# Patient Record
Sex: Female | Born: 1955 | Race: White | Hispanic: No | Marital: Married | Smoking: Former smoker
Health system: Southern US, Community
[De-identification: ages and names within clinical notes are randomized; demographics above are authoritative.]

## PROBLEM LIST (undated history)

## (undated) DIAGNOSIS — I10 Essential (primary) hypertension: Secondary | ICD-10-CM

## (undated) DIAGNOSIS — F32A Depression, unspecified: Secondary | ICD-10-CM

## (undated) DIAGNOSIS — F329 Major depressive disorder, single episode, unspecified: Secondary | ICD-10-CM

## (undated) DIAGNOSIS — J449 Chronic obstructive pulmonary disease, unspecified: Secondary | ICD-10-CM

## (undated) DIAGNOSIS — M7918 Myalgia, other site: Secondary | ICD-10-CM

## (undated) HISTORY — PX: ABDOMINAL HYSTERECTOMY: SHX81

## (undated) HISTORY — PX: HERNIA REPAIR: SHX51

---

## 1997-08-21 ENCOUNTER — Ambulatory Visit (HOSPITAL_COMMUNITY): Admission: RE | Admit: 1997-08-21 | Discharge: 1997-08-21 | Payer: Self-pay | Admitting: Podiatry

## 1999-11-04 ENCOUNTER — Ambulatory Visit (HOSPITAL_COMMUNITY): Admission: RE | Admit: 1999-11-04 | Discharge: 1999-11-05 | Payer: Self-pay | Admitting: Surgery

## 2002-10-01 ENCOUNTER — Ambulatory Visit (HOSPITAL_COMMUNITY): Admission: RE | Admit: 2002-10-01 | Discharge: 2002-10-01 | Payer: Self-pay | Admitting: Gastroenterology

## 2002-10-04 ENCOUNTER — Encounter: Payer: Self-pay | Admitting: Internal Medicine

## 2002-10-04 ENCOUNTER — Encounter: Admission: RE | Admit: 2002-10-04 | Discharge: 2002-10-04 | Payer: Self-pay | Admitting: Internal Medicine

## 2002-10-10 ENCOUNTER — Encounter: Admission: RE | Admit: 2002-10-10 | Discharge: 2002-10-10 | Payer: Self-pay | Admitting: Internal Medicine

## 2002-10-10 ENCOUNTER — Encounter: Payer: Self-pay | Admitting: Internal Medicine

## 2002-10-17 ENCOUNTER — Encounter: Admission: RE | Admit: 2002-10-17 | Discharge: 2002-10-17 | Payer: Self-pay | Admitting: Internal Medicine

## 2002-10-17 ENCOUNTER — Encounter: Payer: Self-pay | Admitting: Internal Medicine

## 2002-10-22 ENCOUNTER — Ambulatory Visit (HOSPITAL_COMMUNITY): Admission: RE | Admit: 2002-10-22 | Discharge: 2002-10-22 | Payer: Self-pay | Admitting: Gastroenterology

## 2005-01-06 ENCOUNTER — Encounter: Admission: RE | Admit: 2005-01-06 | Discharge: 2005-01-06 | Payer: Self-pay | Admitting: Internal Medicine

## 2005-03-15 ENCOUNTER — Ambulatory Visit (HOSPITAL_COMMUNITY): Admission: RE | Admit: 2005-03-15 | Discharge: 2005-03-15 | Payer: Self-pay | Admitting: Specialist

## 2005-03-15 ENCOUNTER — Ambulatory Visit (HOSPITAL_BASED_OUTPATIENT_CLINIC_OR_DEPARTMENT_OTHER): Admission: RE | Admit: 2005-03-15 | Discharge: 2005-03-15 | Payer: Self-pay | Admitting: Specialist

## 2006-10-21 ENCOUNTER — Ambulatory Visit (HOSPITAL_BASED_OUTPATIENT_CLINIC_OR_DEPARTMENT_OTHER): Admission: RE | Admit: 2006-10-21 | Discharge: 2006-10-21 | Payer: Self-pay | Admitting: Specialist

## 2006-11-01 ENCOUNTER — Other Ambulatory Visit: Admission: RE | Admit: 2006-11-01 | Discharge: 2006-11-01 | Payer: Self-pay | Admitting: Geriatric Medicine

## 2007-01-03 ENCOUNTER — Ambulatory Visit (HOSPITAL_COMMUNITY): Admission: RE | Admit: 2007-01-03 | Discharge: 2007-01-03 | Payer: Self-pay | Admitting: Obstetrics and Gynecology

## 2007-08-18 ENCOUNTER — Encounter: Admission: RE | Admit: 2007-08-18 | Discharge: 2007-08-18 | Payer: Self-pay | Admitting: Obstetrics and Gynecology

## 2008-04-22 ENCOUNTER — Encounter: Admission: RE | Admit: 2008-04-22 | Discharge: 2008-04-22 | Payer: Self-pay | Admitting: Internal Medicine

## 2008-10-04 ENCOUNTER — Inpatient Hospital Stay (HOSPITAL_COMMUNITY): Admission: RE | Admit: 2008-10-04 | Discharge: 2008-10-09 | Payer: Self-pay | Admitting: Surgery

## 2008-11-13 ENCOUNTER — Ambulatory Visit (HOSPITAL_COMMUNITY): Admission: RE | Admit: 2008-11-13 | Discharge: 2008-11-14 | Payer: Self-pay | Admitting: Specialist

## 2009-05-26 ENCOUNTER — Encounter: Admission: RE | Admit: 2009-05-26 | Discharge: 2009-05-26 | Payer: Self-pay | Admitting: Internal Medicine

## 2009-06-02 ENCOUNTER — Encounter: Admission: RE | Admit: 2009-06-02 | Discharge: 2009-06-02 | Payer: Self-pay | Admitting: Internal Medicine

## 2010-05-17 ENCOUNTER — Encounter: Payer: Self-pay | Admitting: Internal Medicine

## 2010-08-02 LAB — CBC
Hemoglobin: 12 g/dL (ref 12.0–15.0)
Platelets: 195 10*3/uL (ref 150–400)
RDW: 12.7 % (ref 11.5–15.5)

## 2010-08-02 LAB — BASIC METABOLIC PANEL
Calcium: 9.6 mg/dL (ref 8.4–10.5)
GFR calc Af Amer: >60 mL/min (ref >60)
GFR calc non Af Amer: >60 mL/min (ref >60)
Glucose, Bld: 87 mg/dL (ref 70–99)
Sodium: 144 mEq/L (ref 135–145)

## 2010-08-03 LAB — BASIC METABOLIC PANEL
GFR calc non Af Amer: >60 mL/min (ref >60)
Glucose, Bld: 94 mg/dL (ref 70–99)
Potassium: 4.9 mEq/L (ref 3.5–5.1)
Sodium: 147 mEq/L — ABNORMAL HIGH (ref 135–145)

## 2010-08-03 LAB — DIFFERENTIAL
Lymphocytes Relative: 30 % (ref 12–46)
Lymphs Abs: 1.8 10*3/uL (ref 0.7–4.0)
Monocytes Relative: 9 % (ref 3–12)
Neutro Abs: 3.3 10*3/uL (ref 1.7–7.7)
Neutrophils Relative %: 53 % (ref 43–77)

## 2010-08-03 LAB — HEMOGLOBIN AND HEMATOCRIT, BLOOD
HCT: 37 % (ref 36.0–46.0)
Hemoglobin: 12.8 g/dL (ref 12.0–15.0)

## 2010-08-03 LAB — CBC
RBC: 2.87 MIL/uL — ABNORMAL LOW (ref 3.87–5.11)
WBC: 6.3 10*3/uL (ref 4.0–10.5)

## 2010-08-06 ENCOUNTER — Ambulatory Visit
Admission: RE | Admit: 2010-08-06 | Discharge: 2010-08-06 | Disposition: A | Discharge status: Home / Self Care | Payer: BC Managed Care – PPO | Source: Ambulatory Visit | Attending: Internal Medicine | Admitting: Internal Medicine

## 2010-08-06 ENCOUNTER — Other Ambulatory Visit: Payer: Self-pay | Admitting: Internal Medicine

## 2010-08-06 ENCOUNTER — Emergency Department (HOSPITAL_COMMUNITY)
Admission: EM | Admit: 2010-08-06 | Discharge: 2010-08-06 | Disposition: A | Discharge status: Home / Self Care | Payer: BC Managed Care – PPO | Attending: Emergency Medicine | Admitting: Emergency Medicine

## 2010-08-06 ENCOUNTER — Encounter (HOSPITAL_COMMUNITY): Payer: Self-pay | Admitting: Radiology

## 2010-08-06 ENCOUNTER — Emergency Department (HOSPITAL_COMMUNITY): Payer: BC Managed Care – PPO

## 2010-08-06 DIAGNOSIS — R079 Chest pain, unspecified: Secondary | ICD-10-CM

## 2010-08-06 DIAGNOSIS — Z79899 Other long term (current) drug therapy: Secondary | ICD-10-CM | POA: Insufficient documentation

## 2010-08-06 DIAGNOSIS — I10 Essential (primary) hypertension: Secondary | ICD-10-CM | POA: Insufficient documentation

## 2010-08-06 DIAGNOSIS — E7800 Pure hypercholesterolemia, unspecified: Secondary | ICD-10-CM | POA: Insufficient documentation

## 2010-08-06 DIAGNOSIS — R509 Fever, unspecified: Secondary | ICD-10-CM | POA: Insufficient documentation

## 2010-08-06 DIAGNOSIS — J189 Pneumonia, unspecified organism: Secondary | ICD-10-CM | POA: Insufficient documentation

## 2010-08-06 DIAGNOSIS — R0989 Other specified symptoms and signs involving the circulatory and respiratory systems: Secondary | ICD-10-CM | POA: Insufficient documentation

## 2010-08-06 DIAGNOSIS — R0609 Other forms of dyspnea: Secondary | ICD-10-CM | POA: Insufficient documentation

## 2010-08-06 DIAGNOSIS — R05 Cough: Secondary | ICD-10-CM | POA: Insufficient documentation

## 2010-08-06 DIAGNOSIS — R059 Cough, unspecified: Secondary | ICD-10-CM | POA: Insufficient documentation

## 2010-08-06 HISTORY — DX: Essential (primary) hypertension: I10

## 2010-08-06 LAB — POCT I-STAT, CHEM 8
Chloride: 105 mEq/L (ref 96–112)
Creatinine, Ser: 0.8 mg/dL (ref 0.4–1.2)
Glucose, Bld: 88 mg/dL (ref 70–99)
Potassium: 4.6 mEq/L (ref 3.5–5.1)

## 2010-08-06 MED ORDER — IOHEXOL 300 MG/ML  SOLN
95.0000 mL | Freq: Once | INTRAMUSCULAR | Status: AC | PRN
  Administered 2010-08-06: 95 mL via INTRAVENOUS

## 2010-08-10 ENCOUNTER — Other Ambulatory Visit: Payer: Self-pay

## 2010-09-08 NOTE — Op Note (Signed)
NAME:  [name] [name], [name] [name]                   ACCOUNT NO.:  [id number]   MEDICAL RECORD NO.:  [id number]          PATIENT TYPE:  AMB   LOCATION:  SDC                           FACILITY:  [facility]   PHYSICIAN:  Gerald Leitz, MD          DATE OF BIRTH:  06/04/1955   DATE OF PROCEDURE:  01/03/2007  DATE OF DISCHARGE:                               OPERATIVE REPORT   PREOPERATIVE DIAGNOSES:  1. Endometrial cells on Pap smear.  2. Thickened endometrium on ultrasound.   POSTOPERATIVE DIAGNOSES:  1. Endometrial cells on Pap smear.  2. Thickened endometrium on ultrasound.   PROCEDURE:  Failed dilation and curettage.   SURGEON:  Gerald Leitz, MD   ASSISTANT:  None.   ANESTHESIA:  General.   FINDINGS:  Severely stenotic and atrophic vagina and cervix.   SPECIMEN:  None.   ESTIMATED BLOOD LOSS:  5 mL.   COMPLICATIONS:  None.   DISPOSITION:  Patient to PACU in good condition.   INDICATIONS:  This is a 55 year old with history of an abnormal Pap  smear that showed endometrial cells.  She had an ultrasound that showed  a thickened endometrium in a postmenopausal female with endometrial  stripe measuring 58 mm.  Endometrial biopsy was attempted in the office  and failed due to cervical stenosis.  She is here today for dilation and  curettage.   PROCEDURE:  The patient was taken to the operating room, where she was  placed under general anesthesia.  She was placed in dorsal lithotomy  position.  She was prepped and draped in the usual sterile fashion.  The  bladder was emptied of 100 mL prior to application of the sterile  drapes.  A bivalved speculum was placed into the vaginal vault.  The  cervix was very difficult to locate due to atrophy of the vagina and the  cervix.  What was thought to be the cervix was grasped with a single-  tooth tenaculum anteriorly as well as posteriorly.  The cervical os was  found to be stenotic due to atrophy of the vagina and the cervix.  Ultrasonographer was called and  the procedure proceeded under ultrasound  guidance.  Due to difficulty visualizing the uterus via abdominal  ultrasound, the bladder was filled with 250 mL of normal saline.  The  uterus was identified along with a very narrow cervix.  A 1-mm dilator  was inserted to what was thought to be the cervical os.  The dilator was  noted in the cervical canal; however, total dilation could not be  achieved without risking perforation. After multiple attempts at  dilatation, the procedure was aborted to avoid risk of the uterine  perforation.  There was no evidence of free fluid in the abdomen at the  end of the procedure.  The single-tooth tenaculum was removed from the  cervix and vaginal mucosa.  There was found to be bleeding from the site  of the single-tooth tenaculum; silver nitrate was applied to the site to  obtain hemostasis.  The patient was awakened from anesthesia  and taken  to the recovery room, awake and in stable condition.      Gerald Leitz, MD  Electronically Signed     TC/MEDQ  D:  01/03/2007  T:  01/04/2007  Job:  [id number]

## 2010-09-08 NOTE — Op Note (Signed)
NAME:  [name], [name]                   ACCOUNT NO.:  [id number]   MEDICAL RECORD NO.:  [id number]          PATIENT TYPE:  OIB   LOCATION:  1613                         FACILITY:  [facility]   PHYSICIAN:  Jene Every, M.D.    DATE OF BIRTH:  01-16-1956   DATE OF PROCEDURE:  11/13/2008  DATE OF DISCHARGE:                               OPERATIVE REPORT   PREOPERATIVE DIAGNOSES:  1. Gluteus medius tear of left hip.  2. Bursitis of the left hip.  3. Tight fascia lata band.   POSTOPERATIVE DIAGNOSES:  1. Gluteus medius tear of left hip.  2. Bursitis of the left hip.  3. Tight fascia lata band.   PROCEDURE PERFORMED:  1. Open release of tensor fascia lata.  2. Resection of hypertrophic bursa.  3. Repair of gluteus medius tendon.   ANESTHESIA:  General.   ASSISTANT:  Dr. Darrelyn Hillock.   BRIEF HISTORY:  A 55 year old with refractory hip pain with MRI  indicating gluteus medius tendon tear.  She is indicated for repair,  release and bursectomy.  Risks and benefits discussed including  bleeding, infection, persistent symptoms, need for repeat repair, etc.   TECHNIQUE:  The patient in supine position.  After administration of  adequate general anesthesia and 2 g of Kefzol, she was placed in the  right lateral decubitus position.  All bony prominences were well  padded.  Left hip peritrochanteric region was prepped and draped in  usual sterile fashion.  The incision was placed over the greater  trochanter approximately 10 cm in length.  Subcutaneous tissue was  dissected, and electrocautery was used to achieve hemostasis.  Fascia  lata was identified and found to be erythematous and edematous.  It was  divided in line with the incision with a significant release of tension  appreciated.  This was very tight, and beneath this, I saw hypertrophic  bursa which was incised distally, proximally, anteriorly and  posteriorly.  There was hyperemic gluteus medius attachment.  Although  it was elevated  from the proximal portion of the trochanter, it was  detached distally, anteriorly and posteriorly.  We made a small incision  approximately a centimeter and a half in length through that midportion  of the tendon, curetted the bone beneath that to good bleeding tissue,  excised degenerated tendon and repaired it side-to-side with good  bleeding tendon with #1 interrupted Vicryl figure-of-eight sutures,  closing that pocket.   Wound was copiously irrigated.  This was an excellent repair.  The  remainder of the abductor mechanism was intact.  External rotators were  intact as well.  We discussed this with Dr. Darrelyn Hillock and felt  intraoperatively leaving a small portion up to 2 cm in length of the  fascia lata open over the trochanter would release the tension, as  attempting to pull out over even with probable fenestration would put  undue tension on the greater trochanter and cause recurrent pain and  bursitis.  The ends of the fascia lata were repaired with #1 Vicryl  figure-of-eight suture at the musculotendinous junction.  Wound  copiously irrigated  and injected with 0.25% Marcaine with epinephrine.  I then closed the fat in 2 layers with 2-0 Vicryl simple sutures, and  the skin was reapproximated staples.  Wound was dressed sterilely,  placed on a hospital bed, extubated without difficulty and transported  to the recovery room in satisfactory condition.   The patient tolerated the procedure well with no complications and  minimal estimated blood loss.      Jene Every, M.D.  Electronically Signed     JB/MEDQ  D:  11/13/2008  T:  11/13/2008  Job:  [id number]

## 2010-09-08 NOTE — Op Note (Signed)
NAME:  [name] [name], [name] [name]                   ACCOUNT NO.:  [id number]   MEDICAL RECORD NO.:  [id number]          PATIENT TYPE:  AMB   LOCATION:  NESC                         FACILITY:  [facility]   PHYSICIAN:  Jene Every, M.D.    DATE OF BIRTH:  01/15/1956   DATE OF PROCEDURE:  10/21/2006  DATE OF DISCHARGE:                               OPERATIVE REPORT   PREOPERATIVE DIAGNOSES:  1. Degenerative joint disease.  2. Medial meniscal tear of the right knee.   POSTOPERATIVE DIAGNOSES:  1. 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  2. Medial meniscal tear.   PROCEDURE PERFORMED:  1. Right knee arthroscopy.  2. Partial medial meniscectomy.  3. Chondroplasty of patella and medial femoral condyle.   BRIEF HISTORY/INDICATIONS:  This is a 55 year old female with knee pain,  degenerative changes of the knee and some locking and giving way of the  knee.  She had been refractory with corticosteroid injections, rest and  anti-inflammatory medications, was indicated for diagnostic arthroscopy  evaluation and surgical procedure.  Risks and benefits discussed  including bleeding, infection, no change in symptoms or worsening  symptoms, need for repeat debridement and total knee arthroplasty in the  future, etc.   TECHNIQUE:  With the patient in the supine position, after the induction  of adequate anesthesia with 1 g of Kefzol the right lower extremity was  prepped and draped in the usual sterile fashion.  A lateral parapatellar  portal and a superomedial parapatellar portal was fashioned with a #11  blade, ingress cannula atraumatically placed.  Irrigant was utilized to  insufflate the joint.  Under direct visualization a medial parapatellar  portal was fashioned with a #11 blade after localization with an 18  gauge needle, sparing the medial meniscus.  Noted initially was a tear  of the posterior third of the medial meniscus.  This was unstable, a  basket rongeur was introduced and  utilized to perform partial medial  meniscectomy to a stable base.  Half of the posterior third was removed.  A 42 Kuda shaver was introduced and utilized to perform a further  shaving of the meniscus, chondroplasty of grade 3 changes of the femoral  condyle which was extensive.  Tibial plateau was relatively  unremarkable.  There was a small partial tear of the ACL, anterior  drawer was negative.  Lateral compartment was normal with normal femoral  condyle, tibial plateau and meniscus, stable to palpation, no chondral  defects on the surfaces.  Grade 3 chondromalacia of patella was noted  and a chondroplasty was performed here, there was normal patellofemoral  tracking.  Gutters were unremarkable.  The knee was copiously lavaged, I  probed the medial meniscus, the remnant was stable.  There was no  further pathology amenable to arthroscopic intervention.  All  instrumentation was removed.  Portals were closed  with #4-0 nylon simple sutures.  Marcaine 0.25% with epinephrine was  infiltrated in the joint.  The wound was dressed sterilely.  She was  awoken without difficulty and transported to the recovery room in  satisfactory condition.   The  patient tolerated the procedure well with no complications.      Jene Every, M.D.  Electronically Signed     JB/MEDQ  D:  10/21/2006  T:  10/21/2006  Job:  [id number]

## 2010-09-08 NOTE — Op Note (Signed)
NAME:  [name] [name], [name] [name]                   ACCOUNT NO.:  [id number]   MEDICAL RECORD NO.:  [id number]          PATIENT TYPE:  OIB   LOCATION:  1533                         FACILITY:  [facility]   PHYSICIAN:  Thornton Park. Daphine Deutscher, MD  DATE OF BIRTH:  16-Apr-1956   DATE OF PROCEDURE:  10/04/2008  DATE OF DISCHARGE:                               OPERATIVE REPORT   PREOPERATIVE DIAGNOSIS:  Multiply recurrent lower midline incisional  hernias.   POSTOPERATIVE DIAGNOSES:  Multiply recurrent lower midline incisional  hernias, status post repair.   SURGEON:  Thornton Park. Daphine Deutscher, MD   ASSISTANT:  Anselm Pancoast. Zachery Dakins, M.D.   ANESTHESIA:  General endotracheal.   DESCRIPTION OF PROCEDURE:  [name] was taken to room #1 and given  general anesthesia.  The abdomen was prepped with Techni-Care.  She had  a bowel prep.  This was done preop.  I entered the abdomen through the  left upper quadrant using a 0 degree 5 mm Optiview and then went in and  just found just a ton of adhesions.  I went ahead and worked around  those, got down into the lower abdomen and placed a second 5 mm in the  left lower quadrant.  Through that, I went ahead and went after these  adhesions into the lower midline hernia where I had marked it  preoperatively.  This was where she had, had a piece of Marlex mesh  opened and then they closed, but I guess it pulled through or something,  but the hernia recurred.  I went ahead and spent a lot of time  laparoscopically taking down adhesions, freeing up bowel, realizing that  the bowel was fused to the staples and was not going to come out.  I  went ahead and cut down and did the rest of this open.  I did an  enterolysis all around the fascial ring and then once this was done, I  had freed up the Marlex mesh where I seen where it had been cut and then  closed.  It looked like the running suture of Prolene that they had put  in there had just pulled out and unraveled.  Therefore, after doing  that, I decided I was going to incorporate that into my closure, but to  try primary closure using both interrupted sutures and retention  sutures.  I then put in four retention sutures through-and-through the  abdominal wall.  Next to the bowel, I put a 6 x 6 inch piece of Vicryl  mesh and used that as not only  a barrier, but also I stitched it to the  wall of the abdomen with 4-0 Vicryls to kind of act as a bowel  retractor.  With that in place, I then put in my four retention sutures.  The midline was then closed with multiple small #1 Novofils which was  then tied to approximate the wound.  This was irrigated and injected  with Marcaine and then I tied the retention sutures down and put them  over bridges.  The wound was then closed with  the stapler.  The patient  seemed to tolerate the procedure well and was taken to recovery room in  satisfactory condition.      Thornton Park Daphine Deutscher, MD  Electronically Signed     MBM/MEDQ  D:  10/04/2008  T:  10/04/2008  Job:  [id number]

## 2010-09-08 NOTE — H&P (Signed)
NAME:  Halsted, [name] [name]                   ACCOUNT NO.:  [id number]   MEDICAL RECORD NO.:  [id number]          PATIENT TYPE:  AMB   LOCATION:  SDC                           FACILITY:  [facility]   PHYSICIAN:  Gerald Leitz, MD          DATE OF BIRTH:  05-17-1955   DATE OF ADMISSION:  01/03/2007  DATE OF DISCHARGE:                              HISTORY & PHYSICAL   HISTORY OF PRESENT ILLNESS:  This is a 55 year old G3, P3 with  endometrial cells on Pap smear and ultrasound with abnormal endometrial  stripe of 58 mm.  She is post menopausal.  Endometrial biopsy was  attempted in the office and could not be performed on December 14, 2006  due to cervical stenosis.  She desires evaluation of the endometrium via  D&C.   PAST OB HISTORY:  C-sections x3.   PAST GYN HISTORY:  Menarche at the age of 5.   PAST MEDICAL HISTORY:  1. Hypothyroidism.  2. Hypertension.  3. Anxiety.  4. Depression.  5. Chronic back pain.   PAST SURGICAL HISTORY:  1. C-section x3.  2. Hernia repair x3.  3. Knee surgery x2.   MEDICATIONS:  Zoloft, Synthroid, lisinopril, temazepam, Flexeril,  hydrocodone.   ALLERGIES:  ASPIRIN, LATEX.   SOCIAL HISTORY:  Patient is married.  She smokes two cigarettes a day.  Denies alcohol or illicit drug use.   FAMILY HISTORY:  Negative for breast or ovarian cancer with colon  cancer.   REVIEW OF SYSTEMS:  Negative except as stated in the history of current  illness.   PHYSICAL EXAMINATION:  VITAL SIGNS:  Weight 202 pounds.  Blood pressure  116/70.  CARDIOVASCULAR:  Regular rate and rhythm.  LUNGS:  Clear to auscultation bilaterally.  ABDOMEN:  Soft, nontender, nondistended.  Positive bowel sounds.  EXTREMITIES:  No clubbing, cyanosis or edema.  PELVIC:  Atrophic external female genitalia.  No vulvar or vaginal  lesions are seen.  The cervix is stenotic with some cervical atrophy.  Bimanual exam reveals normal-sized uterus.  No adnexal masses or  tenderness.   IMPRESSION/PLAN:  A  55 year old with endometrial cells on Pap smear and  thickened endometrium on ultrasound, failed endometrial biopsy.  Recommend dilatation and curettage.  Risks, benefits and alternatives of  the surgery were discussed with the patient, including but not limited  to infection, bleeding, possible uterine perforation with need for  further surgery.  Patient voiced understanding, desires to proceed with  dilatation and curettage.   Ultrasound performed on August 13th to the uterus measures 7.1 cm.  AP  diameter, 3.7 cm with 4.2 cm.  Positive anteverted.      Gerald Leitz, MD  Electronically Signed     TC/MEDQ  D:  01/02/2007  T:  01/02/2007  Job:  [id number]

## 2010-09-11 NOTE — Op Note (Signed)
   NAME:  [name] [name], [name] [name]                             ACCOUNT NO.:  [id number]   MEDICAL RECORD NO.:  [id number]                   PATIENT TYPE:  AMB   LOCATION:  ENDO                                 FACILITY:  [facility]   PHYSICIAN:  Danise Edge, M.D.                DATE OF BIRTH:  1955/06/01   DATE OF PROCEDURE:  10/01/2002  DATE OF DISCHARGE:                                 OPERATIVE REPORT   PROCEDURE:  Colonoscopy.   INDICATIONS FOR PROCEDURE:  [name] is a 55 year old female born 21-Nov-1955. [name] has intermittent painless hematochezia.   ENDOSCOPIST:  Charolett Bumpers, M.D.   PREMEDICATION:  Versed 10 mg, Demerol 100 mg .   DESCRIPTION OF PROCEDURE:  After obtaining informed consent, [name] was  placed in the left lateral decubitus position. I administered intravenous  Demerol and intravenous Versed to achieve conscious sedation for the  procedure. The patient's blood pressure, oxygen saturation and cardiac  rhythm were monitored throughout the procedure and documented in the medical  record.   Anal inspection was normal. Digital rectal exam was normal. The Olympus  pediatric video colonoscope was introduced into the rectum and easily  advanced to the cecum. Colonic preparation for the exam today was excellent.   RECTUM:  Normal. Retroflexed view of the distal rectum reveals a large  oozing internal hemorrhoid.   SIGMOID COLON AND DESCENDING COLON:  Normal.   SPLENIC FLEXURE:  Normal.   TRANSVERSE COLON:  Normal.   HEPATIC FLEXURE:  Normal.   ASCENDING COLON:  Normal.   CECUM AND ILEOCECAL VALVE:  Normal.   ASSESSMENT:  Normal proctocolonoscopy to the cecum except for large internal  hemorrhoids one of which was oozing a little blood. No endoscopic evidence  for the presence of colorectal neoplasia.   RECOMMENDATIONS:  Repeat colonoscopy in 10 years.                                               Danise Edge, M.D.   MJ/MEDQ  D:  10/01/2002  T:   10/01/2002  Job:  [id number]

## 2010-09-11 NOTE — Discharge Summary (Signed)
NAME:  [name], [name]                   ACCOUNT NO.:  [id number]   MEDICAL RECORD NO.:  [id number]          PATIENT TYPE:  INP   LOCATION:  1533                         [facility]ACILITY:  [facility]   PHYSICIAN:  Thornton Park. Daphine Deutscher, MD  DATE OF BIRTH:  [birth year]   DATE OF ADMISSION:  10/04/2008  DATE OF DISCHARGE:  10/09/2008                               DISCHARGE SUMMARY   ADMITTING DIAGNOSIS:  Recurrent ventral incision hernia.   PROCEDURE:  Laparoscopically-assisted repair of lower midline hernia  (3.5 hours) - complex hernia.   COURSE IN THE HOSPITAL:  [name] [name] is a 55 year old lady who underwent  the above-mentioned operation.  I placed some retention sutures in which  did cause some pain.  She, however, got along better and was ready for  discharge on October 09, 2008.  She was given Tylox for pain and asked to  return to the office in 1 week for her suture removal.   CONDITION:  Improved, status post repair of complex ventral hernia.      Thornton Park Daphine Deutscher, MD  Electronically Signed     MBM/MEDQ  D:  12/02/2008  T:  12/02/2008  Job:  [id number]

## 2010-09-11 NOTE — Op Note (Signed)
[place]  Patient:    [name] [name], [name] [name]                          MRN: [id number] Proc. Date: 11/04/99 Adm. Date:  [id number] Attending:  Katha Cabal CC:         Thora Lance, M.D.             Juluis Mire, M.D.                           Operative Report  PREOPERATIVE DIAGNOSIS: Recurrent umbilical hernia.  POSTOPERATIVE DIAGNOSIS: Incarcerated umbilical hernia containing omentum.  OPERATION/PROCEDURE: Laparoscopic ventral hernia repair.  ANESTHESIA: General endotracheal.  SURGEON: Thornton Park. Daphine Deutscher, M.D.  ASSISTANT:  Gita Kudo, M.D.  INDICATIONS:  The patient is a 55 year old lady that Catalina Lunger, M.D. had repaired an incisional hernia with mesh done in February 1998. A few months ago, the patient began having pain and noticed a bulge in her umbilical region and was noted to have recurrent hernia.  DESCRIPTION OF PROCEDURE:  The patient was taken to operating room #16 and after general anesthesia was administered, the abdomen was prepped with Betadine and then draped sterilely. I made a transverse incision in the left upper quadrant and I inserted the Hasson through the 10 mm and the camera. I then placed a 5 mm in the left lower quadrant and 5 mm in the right upper quadrant and eventually a 5 mm on the right side. Multiple adhesions were taken down off the midline and there I found incarcerated omentum within the small hernia holes. It looked like the mesh had pulled away superiorly and there was a little oblique hole that had a much bigger subcutaneous pocket. I outlined those areas with little needles and then I used a piece of mesh that was about 15 x 20 in dimension. This elliptical piece of mesh was marked with the smooth side to go down and had 8 symmetrically placed 0 Prolene placed all around it. These were sutured tied and the mesh was cut and put in through the Hasson cannula. Using the endoclose  device, I went in and retrieved each of these 8 points of fixation and brought them up through the skin and tied them down. When they were tied down, a nice long ellipse of mesh covered the weakness. This was then tacked in place with the hernia tacker. I did use and about 1/2 of the second tacker to get it all secured. It looked good at the end. Prior to closure, I put a 5 mm scope in and closed the Hasson port, not only with the purse string suture, but with the endoclose device. The wounds were then closed with 4-0 Vicryl with Benzoin and Steri-Strips. The patient seemed to tolerate the procedure well. She will be admitted for observation.  FINAL DIAGNOSIS:  Recurrent ventral hernia, status post repair with Gore-Tex dual mesh. DD:  11/04/99 TD:  11/04/99 Job: [id number]

## 2010-09-11 NOTE — Op Note (Signed)
   NAME:  [name], [name] HEFLEY                            ACCOUNT NO.:  [id number]   MEDICAL RECORD NO.:  [id number]                    PATIENT TYPE:   LOCATION:                                       FACILITY:   PHYSICIAN:  Danise Edge, M.D.                DATE OF BIRTH:  11-25-1955   DATE OF PROCEDURE:  10/22/2002  DATE OF DISCHARGE:                                 OPERATIVE REPORT   PROCEDURE:  Esophagogastroduodenoscopy.   PROCEDURE INDICATION:  [name] [name] is a 55 year old female born 1955-05-26.  [name] [name] has unexplained, predominantly epigastric gas and right  upper quadrant and abdominal discomfort radiating to her right scapular  area.   In June 2004, she underwent the following diagnostic tests:  Proctocolonoscopy to the cecum normal.  CT scan of the abdomen and pelvis  plus abdominal ultrasound reveals fatty infiltration of the liver.  Hepatobiliary scan normal.  Gallbladder ejection fraction 79% which is  normal.   ENDOSCOPIST:  Danise Edge, M.D.   PREMEDICATION:  Versed 10 mg, Demerol 100 mg.   DESCRIPTION OF PROCEDURE:  After obtaining informed consent, [name] [name] was  placed in the left lateral decubitus position.  I administered intravenous  Demerol and intravenous Versed to achieve conscious sedation for the  procedure.  The patient's blood pressure, oxygen saturation, and cardiac  rhythm were monitored throughout the procedure and documented in the medical  record.   The Olympus gastroscope was passed through the posterior hypopharynx and  into the proximal esophagus without difficulty.  The hypopharynx, larynx and  vocal cords appeared normal.   Esophagoscopy:  The proximal mid and lower segments of the esophageal mucosa  appear normal.   Gastroscopy:  A retroflex view of the gastric cardia and fundus was normal.  The gastric body, antrum and pylorus appear normal.  A biopsy was taken from  the distal gastric antrum for CLOtest to rule out Helicobacter  pylori and  antral gastritis.   Duodenoscopy:  The duodenal bulb, mid duodenum and distal duodenum appear  normal.   ASSESSMENT:  Normal esophagogastroduodenoscopy.  CLOtest to rule out  Helicobacter pylori and gastritis.                                                 Danise Edge, M.D.    MJ/MEDQ  D:  10/22/2002  T:  10/22/2002  Job:  [id number]   cc:   Lilla Shook, M.D.  [address]  [place]  [place]  [address]  Fax: [phone]   Georgann Housekeeper, M.D.  [address]  [place]  [place] [address]  Fax: [phone]

## 2010-09-11 NOTE — Op Note (Signed)
NAME:  [name], [name]                   ACCOUNT NO.:  [id number]   MEDICAL RECORD NO.:  [id number]          PATIENT TYPE:  AMB   LOCATION:  [facility]                         FACILITY:  [facility]   PHYSICIAN:  Jene Every, M.D.    DATE OF BIRTH:  [birth year]   DATE OF PROCEDURE:  03/15/2005  DATE OF DISCHARGE:                                 OPERATIVE REPORT   PREOPERATIVE DIAGNOSIS:  Degenerative joint disease of the left knee, loose  body, possible degenerative missed tear.   POSTOPERATIVE DIAGNOSES:  1.  Posterior horn medial meniscus tear.  2.  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  3.  Loose cartilaginous bodies.   PROCEDURE PERFORMED:  Left knee arthroscopy, chondroplasty of the patella,  medial femoral condyle, partial medial meniscectomy with evacuation of loose  bodies.   ANESTHESIA:  General.   BRIEF HISTORY/INDICATIONS:  A 55 year old with left lower extremity  radicular pain, locking, and giving way.  X-rays indicating joint space  narrowing.  The patient had signs and symptoms consistent with chondral flap  tear versus meniscal tear.  We forewent the MRI for the mechanical purposes.  She has been refractory to conservative treatments, indicating arthroscopic  debridement.  Risks and benefits were discussed, including bleeding,  infection, damage to vascular structures, no change in symptoms, worsening  symptoms, need for repeat debridement, or total knee in the future, etc.   TECHNIQUE:  With the patient in the supine position after the induction of  adequate general anesthesia and 1 gm of Kefzol, the left lower extremity was  prepped and draped in the usual sterile fashion.  A lateral parapatellar  portal and a superior medial parapatellar portal was fashioned with a #11  blade.  The ingress cannula was traumatically placed.  Irrigant was utilized  to insufflate the joint.  A lateral parapatellar portal and a superior  medial parapatellar portal  was fashioned with a #11 blade.  Ingress cannula  atraumatically placed.  Irrigant was utilized to insufflate the joint.  Inspection of the suprapatellar pouch revealed a grade 3 chondromalacia of  the patella.  There is normal patellofemoral tracking.  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  Loose cartilaginous debris was noted.  Under direct vision, the medial parapatellar portal was fashioned with a #11  blade after localization with an 18 gauge needle, sparing the medial  meniscus.  Probe placed posteriorly and demonstrated tear in the posterior  horn and posterior third of the medial meniscus.  Unstable to probe  palpation.  A vascular rongeur was introduced, utilizing __________ over a  stable base, further contoured with a 4.2 coude shaver.  Loose bodies were  evacuated.  Chondroplasty, medial femoral condyle, and patella was  performed.  Some minor tearing of the ACL.  This was debrided as well.  The  lateral compartment revealed some minor grade III changes to the femoral  condyle and tibia.  This was debrided with a shaver as well.  The meniscus  was stable  to probe palpation without evidence of tear. The remnant over the  medial meniscus was stable to probe palpation without evidence of residual  tear.  Gutters were unremarkable.  I reviewed all compartments again,  including a suprapatellar pouch, no loose cartilaginous debris or residual  pathology amenable to arthroscopic intervention.  After the knee was  copiously lavaged, all instrumentation was removed.  The portals were closed  with 4-0 nylon simple sutures and 0.25% Marcaine with epinephrine was  infiltrated into the joint.  The  wound was dressed sterilely.  She was awakened without difficulty and  transported to the recovery room in satisfactory condition.   The patient tolerated the procedure well with no complications.       Jene Every, M.D.  Electronically Signed     JB/MEDQ  D:  03/15/2005  T:  03/15/2005  Job:  [id number]

## 2010-09-14 ENCOUNTER — Other Ambulatory Visit: Payer: Self-pay | Admitting: Internal Medicine

## 2010-09-14 DIAGNOSIS — R229 Localized swelling, mass and lump, unspecified: Secondary | ICD-10-CM

## 2011-01-15 ENCOUNTER — Ambulatory Visit
Admission: RE | Admit: 2011-01-15 | Discharge: 2011-01-15 | Disposition: A | Discharge status: Home / Self Care | Payer: BC Managed Care – PPO | Source: Ambulatory Visit | Attending: Internal Medicine | Admitting: Internal Medicine

## 2011-01-15 DIAGNOSIS — R229 Localized swelling, mass and lump, unspecified: Secondary | ICD-10-CM

## 2011-01-18 ENCOUNTER — Other Ambulatory Visit (HOSPITAL_COMMUNITY)
Admission: RE | Admit: 2011-01-18 | Discharge: 2011-01-18 | Disposition: A | Discharge status: Home / Self Care | Payer: BC Managed Care – PPO | Source: Ambulatory Visit | Attending: Internal Medicine | Admitting: Internal Medicine

## 2011-01-18 ENCOUNTER — Other Ambulatory Visit: Payer: Self-pay | Admitting: Internal Medicine

## 2011-01-18 DIAGNOSIS — Z01419 Encounter for gynecological examination (general) (routine) without abnormal findings: Secondary | ICD-10-CM | POA: Insufficient documentation

## 2011-01-18 DIAGNOSIS — Z1159 Encounter for screening for other viral diseases: Secondary | ICD-10-CM | POA: Insufficient documentation

## 2011-02-05 LAB — CBC
Hemoglobin: 13
MCHC: 35.6
MCV: 91.6
RBC: 3.99
WBC: 7

## 2011-02-05 LAB — BASIC METABOLIC PANEL
CO2: 30
Calcium: 9.3
Chloride: 100
Creatinine, Ser: 0.91
Glucose, Bld: 90
Sodium: 139

## 2011-09-14 ENCOUNTER — Other Ambulatory Visit: Payer: Self-pay | Admitting: Internal Medicine

## 2011-09-15 ENCOUNTER — Ambulatory Visit
Admission: RE | Admit: 2011-09-15 | Discharge: 2011-09-15 | Disposition: A | Discharge status: Home / Self Care | Payer: BC Managed Care – PPO | Source: Ambulatory Visit | Attending: Internal Medicine | Admitting: Internal Medicine

## 2012-07-31 ENCOUNTER — Other Ambulatory Visit: Payer: Self-pay | Admitting: Nurse Practitioner

## 2012-07-31 ENCOUNTER — Other Ambulatory Visit: Payer: Self-pay | Admitting: Internal Medicine

## 2012-07-31 DIAGNOSIS — N644 Mastodynia: Secondary | ICD-10-CM

## 2012-07-31 DIAGNOSIS — N6310 Unspecified lump in the right breast, unspecified quadrant: Secondary | ICD-10-CM

## 2012-07-31 DIAGNOSIS — R911 Solitary pulmonary nodule: Secondary | ICD-10-CM

## 2012-07-31 DIAGNOSIS — N6320 Unspecified lump in the left breast, unspecified quadrant: Secondary | ICD-10-CM

## 2012-07-31 DIAGNOSIS — R109 Unspecified abdominal pain: Secondary | ICD-10-CM

## 2012-08-01 ENCOUNTER — Ambulatory Visit
Admission: RE | Admit: 2012-08-01 | Discharge: 2012-08-01 | Disposition: A | Discharge status: Home / Self Care | Payer: BC Managed Care – PPO | Source: Ambulatory Visit | Attending: Nurse Practitioner | Admitting: Nurse Practitioner

## 2012-08-01 DIAGNOSIS — R109 Unspecified abdominal pain: Secondary | ICD-10-CM

## 2012-08-01 DIAGNOSIS — R911 Solitary pulmonary nodule: Secondary | ICD-10-CM

## 2012-08-01 MED ORDER — IOHEXOL 300 MG/ML  SOLN
125.0000 mL | Freq: Once | INTRAMUSCULAR | Status: AC | PRN
  Administered 2012-08-01: 125 mL via INTRAVENOUS

## 2012-08-11 ENCOUNTER — Ambulatory Visit
Admission: RE | Admit: 2012-08-11 | Discharge: 2012-08-11 | Disposition: A | Discharge status: Home / Self Care | Payer: BC Managed Care – PPO | Source: Ambulatory Visit | Attending: Internal Medicine | Admitting: Internal Medicine

## 2012-08-11 DIAGNOSIS — N644 Mastodynia: Secondary | ICD-10-CM

## 2012-08-11 DIAGNOSIS — N6320 Unspecified lump in the left breast, unspecified quadrant: Secondary | ICD-10-CM

## 2012-08-11 DIAGNOSIS — N6310 Unspecified lump in the right breast, unspecified quadrant: Secondary | ICD-10-CM

## 2014-02-08 ENCOUNTER — Ambulatory Visit
Admission: RE | Admit: 2014-02-08 | Discharge: 2014-02-08 | Disposition: A | Discharge status: Home / Self Care | Payer: BC Managed Care – PPO | Source: Ambulatory Visit | Attending: Internal Medicine | Admitting: Internal Medicine

## 2014-02-08 ENCOUNTER — Other Ambulatory Visit: Payer: Self-pay | Admitting: Internal Medicine

## 2014-02-08 DIAGNOSIS — J209 Acute bronchitis, unspecified: Secondary | ICD-10-CM

## 2014-02-22 ENCOUNTER — Other Ambulatory Visit (HOSPITAL_COMMUNITY): Payer: Self-pay | Admitting: Respiratory Therapy

## 2014-02-22 DIAGNOSIS — J441 Chronic obstructive pulmonary disease with (acute) exacerbation: Secondary | ICD-10-CM

## 2014-02-28 ENCOUNTER — Ambulatory Visit (HOSPITAL_COMMUNITY)
Admission: RE | Admit: 2014-02-28 | Discharge: 2014-02-28 | Disposition: A | Discharge status: Home / Self Care | Payer: BC Managed Care – PPO | Source: Ambulatory Visit | Attending: Internal Medicine | Admitting: Internal Medicine

## 2014-02-28 DIAGNOSIS — J441 Chronic obstructive pulmonary disease with (acute) exacerbation: Secondary | ICD-10-CM | POA: Diagnosis present

## 2014-02-28 LAB — PULMONARY FUNCTION TEST
DL/VA % pred: 114 %
DL/VA: 5.77 ml/min/mmHg/L
DLCO UNC: 17.43 ml/min/mmHg
DLCO cor % pred: 64 %
DLCO cor: 17.43 ml/min/mmHg
DLCO unc % pred: 64 %
FEF 25-75 Pre: 1.08 L/sec
FEF2575-%PRED-PRE: 43 %
FEV1-%Pred-Pre: 53 %
FEV1-Pre: 1.47 L
FEV1FVC-%Pred-Pre: 94 %
FEV6-%Pred-Pre: 57 %
FEV6-PRE: 1.99 L
FEV6FVC-%PRED-PRE: 103 %
FVC-%PRED-PRE: 55 %
FVC-PRE: 1.99 L
PRE FEV6/FVC RATIO: 100 %
Pre FEV1/FVC ratio: 74 %

## 2014-11-13 ENCOUNTER — Other Ambulatory Visit: Payer: Self-pay | Admitting: Nurse Practitioner

## 2014-11-13 DIAGNOSIS — R1012 Left upper quadrant pain: Secondary | ICD-10-CM

## 2015-01-23 ENCOUNTER — Other Ambulatory Visit: Payer: Self-pay | Admitting: Gastroenterology

## 2015-01-31 ENCOUNTER — Other Ambulatory Visit: Payer: Self-pay | Admitting: Gastroenterology

## 2015-02-03 ENCOUNTER — Encounter (HOSPITAL_COMMUNITY): Payer: Self-pay | Admitting: *Deleted

## 2015-02-05 ENCOUNTER — Other Ambulatory Visit: Payer: Self-pay | Admitting: Gastroenterology

## 2015-02-11 ENCOUNTER — Ambulatory Visit (HOSPITAL_COMMUNITY): Payer: BC Managed Care – PPO | Admitting: Anesthesiology

## 2015-02-11 ENCOUNTER — Encounter (HOSPITAL_COMMUNITY): Payer: Self-pay | Admitting: Anesthesiology

## 2015-02-11 ENCOUNTER — Encounter (HOSPITAL_COMMUNITY)
Admission: RE | Disposition: A | Discharge status: Home / Self Care | Payer: Self-pay | Source: Ambulatory Visit | Attending: Gastroenterology

## 2015-02-11 ENCOUNTER — Ambulatory Visit (HOSPITAL_COMMUNITY)
Admission: RE | Admit: 2015-02-11 | Discharge: 2015-02-11 | Disposition: A | Discharge status: Home / Self Care | Payer: BC Managed Care – PPO | Source: Ambulatory Visit | Attending: Gastroenterology | Admitting: Gastroenterology

## 2015-02-11 DIAGNOSIS — N281 Cyst of kidney, acquired: Secondary | ICD-10-CM | POA: Insufficient documentation

## 2015-02-11 DIAGNOSIS — R911 Solitary pulmonary nodule: Secondary | ICD-10-CM | POA: Insufficient documentation

## 2015-02-11 DIAGNOSIS — K5730 Diverticulosis of large intestine without perforation or abscess without bleeding: Secondary | ICD-10-CM | POA: Diagnosis not present

## 2015-02-11 DIAGNOSIS — I10 Essential (primary) hypertension: Secondary | ICD-10-CM | POA: Diagnosis not present

## 2015-02-11 DIAGNOSIS — J449 Chronic obstructive pulmonary disease, unspecified: Secondary | ICD-10-CM | POA: Insufficient documentation

## 2015-02-11 DIAGNOSIS — F17210 Nicotine dependence, cigarettes, uncomplicated: Secondary | ICD-10-CM | POA: Diagnosis not present

## 2015-02-11 DIAGNOSIS — E7800 Pure hypercholesterolemia, unspecified: Secondary | ICD-10-CM | POA: Diagnosis not present

## 2015-02-11 DIAGNOSIS — E039 Hypothyroidism, unspecified: Secondary | ICD-10-CM | POA: Diagnosis not present

## 2015-02-11 DIAGNOSIS — D122 Benign neoplasm of ascending colon: Secondary | ICD-10-CM | POA: Insufficient documentation

## 2015-02-11 DIAGNOSIS — Z1211 Encounter for screening for malignant neoplasm of colon: Secondary | ICD-10-CM | POA: Diagnosis present

## 2015-02-11 HISTORY — DX: Myalgia, other site: M79.18

## 2015-02-11 HISTORY — DX: Depression, unspecified: F32.A

## 2015-02-11 HISTORY — DX: Major depressive disorder, single episode, unspecified: F32.9

## 2015-02-11 HISTORY — PX: COLONOSCOPY WITH PROPOFOL: SHX5780

## 2015-02-11 HISTORY — DX: Chronic obstructive pulmonary disease, unspecified: J44.9

## 2015-02-11 SURGERY — COLONOSCOPY WITH PROPOFOL
Anesthesia: Monitor Anesthesia Care

## 2015-02-11 MED ORDER — PROPOFOL 500 MG/50ML IV EMUL
INTRAVENOUS | Status: DC | PRN
  Administered 2015-02-11: 50 mg via INTRAVENOUS

## 2015-02-11 MED ORDER — SODIUM CHLORIDE 0.9 % IV SOLN: INTRAVENOUS | Status: DC

## 2015-02-11 MED ORDER — LACTATED RINGERS IV SOLN
INTRAVENOUS | Status: DC | PRN
  Administered 2015-02-11: 08:00:00 via INTRAVENOUS

## 2015-02-11 MED ORDER — ONDANSETRON HCL 4 MG/2ML IJ SOLN
INTRAMUSCULAR | Status: DC | PRN
  Administered 2015-02-11: 4 mg via INTRAVENOUS

## 2015-02-11 MED ORDER — PROPOFOL 10 MG/ML IV BOLUS
INTRAVENOUS | Status: AC
  Filled 2015-02-11: qty 20

## 2015-02-11 MED ORDER — PROPOFOL 500 MG/50ML IV EMUL
INTRAVENOUS | Status: DC | PRN
  Administered 2015-02-11: 300 ug/kg/min via INTRAVENOUS

## 2015-02-11 MED ORDER — SODIUM CHLORIDE 0.9 % IV SOLN
INTRAVENOUS | Status: DC
Start: 2015-02-11 — End: 2015-02-11

## 2015-02-11 SURGICAL SUPPLY — 21 items

## 2015-02-11 NOTE — Op Note (Signed)
Procedure: Screening colonoscopy. Normal screening colonoscopy performed on 10/01/2002  Endoscopist: Earle Gell  Premedication: Propofol administered by anesthesia  Procedure: The patient was placed in the left lateral decubitus position. Anal inspection and digital rectal exam were normal. The Pentax pediatric colonoscope was introduced into the rectum and advanced to the cecum. A normal-appearing appendiceal orifice and ileocecal valve were identified. Colonic preparation for the exam today was good. Withdrawal time was 10 minutes  Rectum. Normal. Retroflexed view of the distal and was normal  Sigmoid colon and descending colon. Left colonic diverticulosis  Splenic flexure. Normal  Transverse colon. Normal  Hepatic flexure. Normal  Ascending colon. A 5 mm sessile polyp was removed from the distal ascending colon with the cold snare and a 5 mm sessile polyp was removed from the proximal ascending colon with the cold snare  Cecum and ileocecal valve. Normal  Assessment: Two small sessile polyps were removed from the ascending colon. Otherwise normal screening colonoscopy  Recommendation: If the colon polyps return adenomatous pathologically, schedule repeat colonoscopy in 5 years

## 2015-02-11 NOTE — Anesthesia Postprocedure Evaluation (Signed)
Anesthesia Post Note  Patient: [name] [name]  Procedure(s) Performed: Procedure(s) (LRB): COLONOSCOPY WITH PROPOFOL (N/A)  Anesthesia type: MAC  Patient location: PACU  Post pain: Pain level controlled  Post assessment: Patient's Cardiovascular Status Stable  Last Vitals:  Filed Vitals:   02/11/15 1000  BP: 144/93  Pulse: 86  Temp:   Resp: 11    Post vital signs: Reviewed and stable  Level of consciousness: sedated  Complications: No apparent anesthesia complications

## 2015-02-11 NOTE — Discharge Instructions (Signed)

## 2015-02-11 NOTE — Anesthesia Preprocedure Evaluation (Addendum)
Anesthesia Evaluation  Patient identified by MRN, date of birth, ID band Patient awake    Reviewed: Allergy & Precautions, NPO status , Patient's Chart, lab work & pertinent test results  Airway Mallampati: I  TM Distance: >3 FB Neck ROM: Full    Dental   Pulmonary COPD, Current Smoker,    Pulmonary exam normal        Cardiovascular hypertension, Pt. on medications Normal cardiovascular exam     Neuro/Psych    GI/Hepatic   Endo/Other    Renal/GU      Musculoskeletal   Abdominal   Peds  Hematology   Anesthesia Other Findings   Reproductive/Obstetrics                            Anesthesia Physical Anesthesia Plan  ASA: II  Anesthesia Plan: MAC   Post-op Pain Management:    Induction: Intravenous  Airway Management Planned: Simple Face Mask  Additional Equipment:   Intra-op Plan:   Post-operative Plan:   Informed Consent: I have reviewed the patients History and Physical, chart, labs and discussed the procedure including the risks, benefits and alternatives for the proposed anesthesia with the patient or authorized representative who has indicated his/her understanding and acceptance.   Dental advisory given  Plan Discussed with: CRNA and Surgeon  Anesthesia Plan Comments:        Anesthesia Quick Evaluation

## 2015-02-11 NOTE — H&P (Signed)
  Procedure: Screening colonoscopy. Normal screening colonoscopy performed on 10/11/2002  History: The patient is a 59 year old female born 1956-04-25. She is scheduled to undergo a repeat screening colonoscopy today.  Past medical history: Hysterectomy. Appendectomy. Ventral hernia repair. Depression. Hypothyroidism. Chronic cigarette smoking. Hypertension. Hypercholesterolemia. Stable pulmonary nodule. Left kidney cyst.  Medication allergies: Morphine. Latex glove. Aspirin.  Exam: The patient is alert and lying comfortably on the endoscopy stretcher. Abdomen is soft and nontender to palpation. Cardiac exam reveals a regular rhythm. Lungs are clear to auscultation.  Plan: Proceed with screening colonoscopy

## 2015-02-11 NOTE — Transfer of Care (Signed)
Immediate Anesthesia Transfer of Care Note  Patient: [name] [name]  Procedure(s) Performed: Procedure(s): COLONOSCOPY WITH PROPOFOL (N/A)  Patient Location: PACU  Anesthesia Type:MAC  Level of Consciousness:  sedated, patient cooperative and responds to stimulation  Airway & Oxygen Therapy:Patient Spontanous Breathing and Patient connected to face mask oxgen  Post-op Assessment:  Report given to PACU RN and Post -op Vital signs reviewed and stable  Post vital signs:  Reviewed and stable  Last Vitals:  Filed Vitals:   02/11/15 0837  BP: 212/164  Temp: 36.9 C  Resp: 15    Complications: No apparent anesthesia complications

## 2015-02-12 ENCOUNTER — Encounter (HOSPITAL_COMMUNITY): Payer: Self-pay | Admitting: Gastroenterology

## 2015-04-07 ENCOUNTER — Ambulatory Visit (HOSPITAL_COMMUNITY): Admit: 2015-04-07 | Payer: Self-pay | Admitting: Gastroenterology

## 2015-04-07 ENCOUNTER — Encounter (HOSPITAL_COMMUNITY): Payer: Self-pay

## 2015-04-07 SURGERY — COLONOSCOPY WITH PROPOFOL
Anesthesia: Monitor Anesthesia Care

## 2019-12-05 ENCOUNTER — Other Ambulatory Visit: Payer: Self-pay | Admitting: Geriatric Medicine

## 2019-12-05 ENCOUNTER — Ambulatory Visit
Admission: RE | Admit: 2019-12-05 | Discharge: 2019-12-05 | Disposition: A | Discharge status: Home / Self Care | Payer: BC Managed Care – PPO | Source: Ambulatory Visit | Attending: Geriatric Medicine | Admitting: Geriatric Medicine

## 2019-12-05 DIAGNOSIS — M25511 Pain in right shoulder: Secondary | ICD-10-CM

## 2019-12-05 DIAGNOSIS — M542 Cervicalgia: Secondary | ICD-10-CM

## 2019-12-13 ENCOUNTER — Other Ambulatory Visit: Payer: Self-pay

## 2019-12-13 ENCOUNTER — Telehealth (HOSPITAL_COMMUNITY): Payer: Self-pay | Admitting: Emergency Medicine

## 2019-12-13 ENCOUNTER — Emergency Department (HOSPITAL_COMMUNITY)
Admission: EM | Admit: 2019-12-13 | Discharge: 2019-12-13 | Disposition: A | Discharge status: Home / Self Care | Payer: Medicare PPO | Attending: Emergency Medicine | Admitting: Emergency Medicine

## 2019-12-13 ENCOUNTER — Encounter (HOSPITAL_COMMUNITY): Payer: Self-pay | Admitting: Emergency Medicine

## 2019-12-13 DIAGNOSIS — F17210 Nicotine dependence, cigarettes, uncomplicated: Secondary | ICD-10-CM | POA: Insufficient documentation

## 2019-12-13 DIAGNOSIS — K649 Unspecified hemorrhoids: Secondary | ICD-10-CM

## 2019-12-13 DIAGNOSIS — J449 Chronic obstructive pulmonary disease, unspecified: Secondary | ICD-10-CM | POA: Diagnosis not present

## 2019-12-13 DIAGNOSIS — I10 Essential (primary) hypertension: Secondary | ICD-10-CM | POA: Diagnosis not present

## 2019-12-13 DIAGNOSIS — Z79899 Other long term (current) drug therapy: Secondary | ICD-10-CM | POA: Insufficient documentation

## 2019-12-13 DIAGNOSIS — R110 Nausea: Secondary | ICD-10-CM | POA: Insufficient documentation

## 2019-12-13 DIAGNOSIS — K648 Other hemorrhoids: Secondary | ICD-10-CM | POA: Insufficient documentation

## 2019-12-13 DIAGNOSIS — R197 Diarrhea, unspecified: Secondary | ICD-10-CM | POA: Diagnosis not present

## 2019-12-13 DIAGNOSIS — Z20822 Contact with and (suspected) exposure to covid-19: Secondary | ICD-10-CM | POA: Diagnosis not present

## 2019-12-13 DIAGNOSIS — K625 Hemorrhage of anus and rectum: Secondary | ICD-10-CM | POA: Insufficient documentation

## 2019-12-13 DIAGNOSIS — A0472 Enterocolitis due to Clostridium difficile, not specified as recurrent: Secondary | ICD-10-CM | POA: Diagnosis not present

## 2019-12-13 DIAGNOSIS — R109 Unspecified abdominal pain: Secondary | ICD-10-CM | POA: Diagnosis present

## 2019-12-13 LAB — URINALYSIS, ROUTINE W REFLEX MICROSCOPIC
Bilirubin Urine: NEGATIVE
Glucose, UA: NEGATIVE mg/dL
Ketones, ur: NEGATIVE mg/dL
Leukocytes,Ua: NEGATIVE
Nitrite: NEGATIVE
Protein, ur: 30 mg/dL — AB
Specific Gravity, Urine: 1.012 (ref 1.005–1.030)
pH: 6 (ref 5.0–8.0)

## 2019-12-13 LAB — COMPREHENSIVE METABOLIC PANEL
ALT: 64 U/L — ABNORMAL HIGH (ref 0–44)
AST: 63 U/L — ABNORMAL HIGH (ref 15–41)
Albumin: 4.2 g/dL (ref 3.5–5.0)
Alkaline Phosphatase: 57 U/L (ref 38–126)
Anion gap: 13 (ref 5–15)
BUN: 6 mg/dL — ABNORMAL LOW (ref 8–23)
CO2: 22 mmol/L (ref 22–32)
Calcium: 9.3 mg/dL (ref 8.9–10.3)
Chloride: 101 mmol/L (ref 98–111)
Creatinine, Ser: 0.69 mg/dL (ref 0.44–1.00)
GFR calc Af Amer: >60 mL/min (ref >60)
GFR calc non Af Amer: >60 mL/min (ref >60)
Glucose, Bld: 104 mg/dL — ABNORMAL HIGH (ref 70–99)
Potassium: 4.1 mmol/L (ref 3.5–5.1)
Sodium: 136 mmol/L (ref 135–145)
Total Bilirubin: 0.8 mg/dL (ref 0.3–1.2)
Total Protein: 7.5 g/dL (ref 6.5–8.1)

## 2019-12-13 LAB — CBC
HCT: 42.4 % (ref 36.0–46.0)
Hemoglobin: 14.7 g/dL (ref 12.0–15.0)
MCH: 35.7 pg — ABNORMAL HIGH (ref 26.0–34.0)
MCHC: 34.7 g/dL (ref 30.0–36.0)
MCV: 102.9 fL — ABNORMAL HIGH (ref 80.0–100.0)
Platelets: 167 10*3/uL (ref 150–400)
RBC: 4.12 MIL/uL (ref 3.87–5.11)
RDW: 11.9 % (ref 11.5–15.5)
WBC: 7.9 10*3/uL (ref 4.0–10.5)
nRBC: 0 % (ref 0.0–0.2)

## 2019-12-13 LAB — SARS CORONAVIRUS 2 BY RT PCR (HOSPITAL ORDER, PERFORMED IN ~~LOC~~ HOSPITAL LAB): SARS Coronavirus 2: NEGATIVE

## 2019-12-13 LAB — C DIFFICILE QUICK SCREEN W PCR REFLEX
C Diff antigen: POSITIVE — AB
C Diff toxin: NEGATIVE

## 2019-12-13 LAB — LIPASE, BLOOD: Lipase: 24 U/L (ref 11–51)

## 2019-12-13 MED ORDER — VANCOMYCIN HCL 250 MG PO CAPS: 250.0000 mg | ORAL_CAPSULE | Freq: Four times a day (QID) | ORAL | 0 refills | Status: AC

## 2019-12-13 MED ORDER — AZITHROMYCIN 250 MG PO TABS: 250.0000 mg | ORAL_TABLET | Freq: Every day | ORAL | 0 refills | Status: DC

## 2019-12-13 NOTE — ED Notes (Signed)
Pt reports multiple episodes of bloody stools and vomiting over  The past week. None today. Pt with hemorrhoids.

## 2019-12-13 NOTE — ED Triage Notes (Signed)
Per pt, states she has been having diarrhea for 2 weeks-states bright red blood mixed in with loose stool-states this is recurrent-patient is a daily drinker

## 2019-12-13 NOTE — ED Provider Notes (Signed)
[facility] DEPT Provider Note   CSN: [id number] Arrival date & time: 12/13/19  6811     History Chief Complaint  Patient presents with  . Abdominal Pain  . Diarrhea    [name] [name] is a 64 y.o. female.  HPI      64yo female with history of COPD, hypertension presents with concern for diarrhea for 2 weeks.  Reports bright red blood mixed with diarrhea.    Reports first thing in the AM will pass blood. Has slowed down now.  Abdominal and rectal pain. Rectal pain after bowel movements. Develops abdominal pain, cramping across periumbilical region, prior to having diarrhea. Has thrown up a few times.  Has had nausea. Yesterday was last episode of emesis, dry heaves. Was having severe diarrhea for the last 2 weeks, reports yesterday was about every 15 minutes for aperiod of time, worse in AM.  When starts moving around gets worse.  Reports she thinks she has gone 50 times a day at least over the last 2 weeks. Slows down at night.  Diarrhea slimy yellow/pink. Bright red blood mixed in.  Blood clots have been passed for about one week with every episode although this week has slowed down some.  Has had this happen 1-2 days in a row and then stop. Has been going on for 2 years on and off and would resolve and wouldn't worry about it-thought due to stress.  No fevers.  No urinary symptoms. Some dizziness. No syncope. Uses ETOH, has had hx of withdrawal in past but does not feel that way at this time.  Called [facility] and made appt for Sept 2nd and said couldn't wait due to bleeding and they recommended coming to ED.    Past Medical History:  Diagnosis Date  . Buttock pain    "right"- at present  . COPD (chronic obstructive pulmonary disease) ([place])   . Depression   . Hypertension     There are no problems to display for this patient.   Past Surgical History:  Procedure Laterality Date  . ABDOMINAL HYSTERECTOMY     complete  . CESAREAN SECTION     x3     . COLONOSCOPY WITH PROPOFOL N/A 02/11/2015   Procedure: COLONOSCOPY WITH PROPOFOL;  Surgeon: Garlan Fair, MD;  Location: [facility] ENDOSCOPY;  Service: Endoscopy;  Laterality: N/A;  . HERNIA REPAIR     umbilical hernia x 4- 5 times, mesh used     OB History   No obstetric history on file.     No family history on file.  Social History   Tobacco Use  . Smoking status: Current Every Day Smoker    Packs/day: 0.50    Years: 10.00    Pack years: 5.00    Types: Cigarettes  . Smokeless tobacco: Never Used  Substance Use Topics  . Alcohol use: Yes    Comment: occ.   . Drug use: No    Home Medications Prior to Admission medications   Medication Sig Start Date End Date Taking? Authorizing Provider  azithromycin (ZITHROMAX) 250 MG tablet Take 1 tablet (250 mg total) by mouth daily. Take first 2 tablets together, then 1 every day until finished. 12/13/19   Gareth Morgan, MD  lisinopril (PRINIVIL,ZESTRIL) 20 MG tablet Take 20 mg by mouth daily. 01/14/15   [provider]  PARoxetine (PAXIL) 20 MG tablet TAKE 1 TABLET IN THE MORNING ONCE A DAY ORALLY 01/21/15   [provider]  pravastatin (  PRAVACHOL) 20 MG tablet Take 20 mg by mouth daily. 11/04/14   [provider]  traZODone (DESYREL) 100 MG tablet Take 100 mg by mouth daily as needed. 01/13/15   [provider]    Allergies    Patient has no known allergies.  Review of Systems   Review of Systems  Constitutional: Negative for fever.  HENT: Negative for sore throat.   Eyes: Negative for visual disturbance.  Respiratory: Negative for cough and shortness of breath.   Cardiovascular: Negative for chest pain.  Gastrointestinal: Positive for blood in stool and diarrhea. Negative for abdominal pain.  Genitourinary: Negative for difficulty urinating.  Musculoskeletal: Negative for back pain and neck pain.  Skin: Negative for rash.  Neurological: Negative for syncope and headaches.    Physical  Exam Updated Vital Signs BP (!) 187/107 (BP Location: Right Arm)   Pulse 87   Temp 98.4 F (36.9 C) (Oral)   Resp 18   Ht 5\' 6"  (1.676 m)   Wt 87.5 kg   SpO2 99%   BMI 31.15 kg/m   Physical Exam Vitals and nursing note reviewed.  Constitutional:      General: She is not in acute distress.    Appearance: Normal appearance. She is not ill-appearing, toxic-appearing or diaphoretic.  HENT:     Head: Normocephalic.  Eyes:     Conjunctiva/sclera: Conjunctivae normal.  Cardiovascular:     Rate and Rhythm: Normal rate and regular rhythm.     Pulses: Normal pulses.  Pulmonary:     Effort: Pulmonary effort is normal. No respiratory distress.  Abdominal:     General: There is no distension or abdominal bruit. There are no signs of injury.     Palpations: Abdomen is soft.  Genitourinary:    Rectum: Tenderness and external hemorrhoid present. No mass.  Musculoskeletal:        General: No deformity or signs of injury.     Cervical back: No rigidity.  Skin:    General: Skin is warm and dry.     Coloration: Skin is not jaundiced or pale.  Neurological:     General: No focal deficit present.     Mental Status: She is alert and oriented to person, place, and time.     ED Results / Procedures / Treatments   Labs (all labs ordered are listed, but only abnormal results are displayed) Labs Reviewed  COMPREHENSIVE METABOLIC PANEL - Abnormal; Notable for the following components:      Result Value   Glucose, Bld 104 (*)    BUN 6 (*)    AST 63 (*)    ALT 64 (*)    All other components within normal limits  CBC - Abnormal; Notable for the following components:   MCV 102.9 (*)    MCH 35.7 (*)    All other components within normal limits  URINALYSIS, ROUTINE W REFLEX MICROSCOPIC - Abnormal; Notable for the following components:   Hgb urine dipstick MODERATE (*)    Protein, ur 30 (*)    Bacteria, UA RARE (*)    All other components within normal limits  SARS CORONAVIRUS 2 BY RT PCR  (HOSPITAL ORDER, [facility])  GASTROINTESTINAL PANEL BY PCR, STOOL (REPLACES STOOL CULTURE)  C DIFFICILE QUICK SCREEN W PCR REFLEX  LIPASE, BLOOD    EKG None  Radiology No results found.  Procedures Procedures (including critical care time)  Medications Ordered in ED Medications - No data to display  ED Course  I have reviewed the triage vital signs and the nursing notes.  Pertinent labs & imaging results that were available during my care of the patient were reviewed by me and considered in my medical decision making (see chart for details).    MDM Rules/Calculators/A&P                           64yo female with history of COPD, hypertension presents with concern for diarrhea for 2 weeks with blood.  Doubt clinically significant GI bleed that would require admission given 2 weeks of symptoms with normal hemoglobin of 14.7.  Has hemorrhoids on exam and symptoms may be secondary to colitis (infectious or inflammatory) and/or hemorrhoidal bleed.  Labs show no significant electrolyte abnormalities.  Doubt diverticulitis given benign exam.    Stool sample collected prior to time of discharge and sent.  No risk factors for CDiff.  Given 2 weeks of symptoms, will treat with empiric azithromycin.  Recommend continued hydration and supportive care.  She is scheduled to follow-up with gastroenterology on September 2.  Recommend PCP/GI follow up.    COVID, CDiff and GI pathogen panel pending. Counseled in receiving COVID vaccine.    Final Clinical Impression(s) / ED Diagnoses Final diagnoses:  Diarrhea of presumed infectious origin  Hemorrhoids, unspecified hemorrhoid type  Rectal bleeding    Rx / DC Orders ED Discharge Orders         Ordered    azithromycin (ZITHROMAX) 250 MG tablet  Daily        12/13/19 1719           Gareth Morgan, MD 12/13/19 1737

## 2019-12-14 LAB — GASTROINTESTINAL PANEL BY PCR, STOOL (REPLACES STOOL CULTURE)

## 2019-12-14 LAB — CLOSTRIDIUM DIFFICILE BY PCR, REFLEXED: Toxigenic C. Difficile by PCR: NEGATIVE

## 2020-05-05 DIAGNOSIS — K921 Melena: Secondary | ICD-10-CM | POA: Diagnosis not present

## 2020-05-05 DIAGNOSIS — K5790 Diverticulosis of intestine, part unspecified, without perforation or abscess without bleeding: Secondary | ICD-10-CM | POA: Diagnosis not present

## 2020-05-05 DIAGNOSIS — N281 Cyst of kidney, acquired: Secondary | ICD-10-CM | POA: Diagnosis not present

## 2020-05-05 DIAGNOSIS — K760 Fatty (change of) liver, not elsewhere classified: Secondary | ICD-10-CM | POA: Diagnosis not present

## 2020-05-05 DIAGNOSIS — R1032 Left lower quadrant pain: Secondary | ICD-10-CM | POA: Diagnosis not present

## 2020-06-05 DIAGNOSIS — Z20822 Contact with and (suspected) exposure to covid-19: Secondary | ICD-10-CM | POA: Diagnosis not present

## 2020-06-05 DIAGNOSIS — Z03818 Encounter for observation for suspected exposure to other biological agents ruled out: Secondary | ICD-10-CM | POA: Diagnosis not present

## 2020-10-01 DIAGNOSIS — B370 Candidal stomatitis: Secondary | ICD-10-CM | POA: Diagnosis not present

## 2020-10-01 DIAGNOSIS — Z03818 Encounter for observation for suspected exposure to other biological agents ruled out: Secondary | ICD-10-CM | POA: Diagnosis not present

## 2020-10-01 DIAGNOSIS — J40 Bronchitis, not specified as acute or chronic: Secondary | ICD-10-CM | POA: Diagnosis not present

## 2020-10-01 DIAGNOSIS — R059 Cough, unspecified: Secondary | ICD-10-CM | POA: Diagnosis not present

## 2020-10-01 DIAGNOSIS — R1110 Vomiting, unspecified: Secondary | ICD-10-CM | POA: Diagnosis not present

## 2020-10-01 DIAGNOSIS — J029 Acute pharyngitis, unspecified: Secondary | ICD-10-CM | POA: Diagnosis not present

## 2020-10-01 DIAGNOSIS — Z8709 Personal history of other diseases of the respiratory system: Secondary | ICD-10-CM | POA: Diagnosis not present

## 2021-06-22 DIAGNOSIS — F1020 Alcohol dependence, uncomplicated: Secondary | ICD-10-CM | POA: Diagnosis not present

## 2021-06-22 DIAGNOSIS — F3341 Major depressive disorder, recurrent, in partial remission: Secondary | ICD-10-CM | POA: Diagnosis not present

## 2021-06-22 DIAGNOSIS — E7800 Pure hypercholesterolemia, unspecified: Secondary | ICD-10-CM | POA: Diagnosis not present

## 2021-06-22 DIAGNOSIS — I10 Essential (primary) hypertension: Secondary | ICD-10-CM | POA: Diagnosis not present

## 2021-06-29 ENCOUNTER — Ambulatory Visit
Admission: RE | Admit: 2021-06-29 | Discharge: 2021-06-29 | Disposition: A | Discharge status: Home / Self Care | Payer: No Typology Code available for payment source | Source: Ambulatory Visit | Attending: Internal Medicine | Admitting: Internal Medicine

## 2021-06-29 ENCOUNTER — Other Ambulatory Visit: Payer: Self-pay | Admitting: Internal Medicine

## 2021-06-29 DIAGNOSIS — M2578 Osteophyte, vertebrae: Secondary | ICD-10-CM | POA: Diagnosis not present

## 2021-06-29 DIAGNOSIS — M8588 Other specified disorders of bone density and structure, other site: Secondary | ICD-10-CM | POA: Diagnosis not present

## 2021-06-29 DIAGNOSIS — M545 Low back pain, unspecified: Secondary | ICD-10-CM | POA: Diagnosis not present

## 2021-06-29 DIAGNOSIS — M4126 Other idiopathic scoliosis, lumbar region: Secondary | ICD-10-CM | POA: Diagnosis not present

## 2021-06-29 DIAGNOSIS — M549 Dorsalgia, unspecified: Secondary | ICD-10-CM | POA: Diagnosis not present

## 2021-06-29 DIAGNOSIS — R7989 Other specified abnormal findings of blood chemistry: Secondary | ICD-10-CM | POA: Diagnosis not present

## 2021-06-29 DIAGNOSIS — Z1211 Encounter for screening for malignant neoplasm of colon: Secondary | ICD-10-CM | POA: Diagnosis not present

## 2021-06-29 DIAGNOSIS — I10 Essential (primary) hypertension: Secondary | ICD-10-CM | POA: Diagnosis not present

## 2021-06-29 DIAGNOSIS — M5489 Other dorsalgia: Secondary | ICD-10-CM

## 2021-07-13 DIAGNOSIS — R748 Abnormal levels of other serum enzymes: Secondary | ICD-10-CM | POA: Diagnosis not present

## 2021-08-10 DIAGNOSIS — M542 Cervicalgia: Secondary | ICD-10-CM | POA: Diagnosis not present

## 2021-08-10 DIAGNOSIS — M545 Low back pain, unspecified: Secondary | ICD-10-CM | POA: Diagnosis not present

## 2021-08-31 DIAGNOSIS — M542 Cervicalgia: Secondary | ICD-10-CM | POA: Diagnosis not present

## 2021-08-31 DIAGNOSIS — M5451 Vertebrogenic low back pain: Secondary | ICD-10-CM | POA: Diagnosis not present

## 2021-09-02 ENCOUNTER — Other Ambulatory Visit: Payer: Self-pay | Admitting: Orthopaedic Surgery

## 2021-09-02 DIAGNOSIS — M545 Low back pain, unspecified: Secondary | ICD-10-CM

## 2021-09-02 DIAGNOSIS — M542 Cervicalgia: Secondary | ICD-10-CM

## 2021-09-19 ENCOUNTER — Ambulatory Visit
Admission: RE | Admit: 2021-09-19 | Discharge: 2021-09-19 | Disposition: A | Discharge status: Home / Self Care | Payer: No Typology Code available for payment source | Source: Ambulatory Visit | Attending: Orthopaedic Surgery | Admitting: Orthopaedic Surgery

## 2021-09-19 DIAGNOSIS — M545 Low back pain, unspecified: Secondary | ICD-10-CM | POA: Diagnosis not present

## 2021-09-19 DIAGNOSIS — M542 Cervicalgia: Secondary | ICD-10-CM

## 2021-09-19 DIAGNOSIS — R200 Anesthesia of skin: Secondary | ICD-10-CM | POA: Diagnosis not present

## 2021-09-19 DIAGNOSIS — M4126 Other idiopathic scoliosis, lumbar region: Secondary | ICD-10-CM | POA: Diagnosis not present

## 2021-09-19 DIAGNOSIS — M48061 Spinal stenosis, lumbar region without neurogenic claudication: Secondary | ICD-10-CM | POA: Diagnosis not present

## 2021-09-23 ENCOUNTER — Emergency Department (HOSPITAL_COMMUNITY)
Admission: EM | Admit: 2021-09-23 | Discharge: 2021-09-24 | Disposition: A | Discharge status: Home / Self Care | Payer: No Typology Code available for payment source | Attending: Emergency Medicine | Admitting: Emergency Medicine

## 2021-09-23 ENCOUNTER — Other Ambulatory Visit: Payer: Self-pay

## 2021-09-23 ENCOUNTER — Emergency Department (HOSPITAL_COMMUNITY): Payer: No Typology Code available for payment source

## 2021-09-23 DIAGNOSIS — S0993XA Unspecified injury of face, initial encounter: Secondary | ICD-10-CM | POA: Diagnosis not present

## 2021-09-23 DIAGNOSIS — W19XXXA Unspecified fall, initial encounter: Secondary | ICD-10-CM

## 2021-09-23 DIAGNOSIS — S199XXA Unspecified injury of neck, initial encounter: Secondary | ICD-10-CM | POA: Diagnosis not present

## 2021-09-23 DIAGNOSIS — S0990XA Unspecified injury of head, initial encounter: Secondary | ICD-10-CM

## 2021-09-23 DIAGNOSIS — M4692 Unspecified inflammatory spondylopathy, cervical region: Secondary | ICD-10-CM | POA: Diagnosis not present

## 2021-09-23 DIAGNOSIS — I6782 Cerebral ischemia: Secondary | ICD-10-CM | POA: Diagnosis not present

## 2021-09-23 DIAGNOSIS — M7989 Other specified soft tissue disorders: Secondary | ICD-10-CM | POA: Diagnosis not present

## 2021-09-23 DIAGNOSIS — Z23 Encounter for immunization: Secondary | ICD-10-CM | POA: Diagnosis not present

## 2021-09-23 DIAGNOSIS — M2578 Osteophyte, vertebrae: Secondary | ICD-10-CM | POA: Diagnosis not present

## 2021-09-23 DIAGNOSIS — G319 Degenerative disease of nervous system, unspecified: Secondary | ICD-10-CM | POA: Diagnosis not present

## 2021-09-23 MED ORDER — TETANUS-DIPHTH-ACELL PERTUSSIS 5-2.5-18.5 LF-MCG/0.5 IM SUSY
0.5000 mL | PREFILLED_SYRINGE | Freq: Once | INTRAMUSCULAR | Status: AC
  Administered 2021-09-24: 0.5 mL via INTRAMUSCULAR
  Filled 2021-09-23: qty 0.5

## 2021-09-23 NOTE — ED Triage Notes (Incomplete)
Patient BIB EMS from home c/o fall. Per report pt hit her head in a table. Pt sustain a laceration on her left ear, Left elbow and right shin. Etoh on board. Pt a/ox4.

## 2021-09-24 ENCOUNTER — Encounter (HOSPITAL_COMMUNITY): Payer: Self-pay | Admitting: Emergency Medicine

## 2021-09-24 NOTE — ED Provider Notes (Signed)
[facility] DEPT Provider Note   CSN: [id number] Arrival date & time: 09/23/21  2321     History  Chief Complaint  Patient presents with   Lytle Michaels    ROMELL CAVANAH is a 66 y.o. female.  The history is provided by the patient.  Fall This is a new problem. The current episode started less than 1 hour ago. The problem occurs constantly. The problem has been resolved. Pertinent negatives include no chest pain, no abdominal pain, no headaches and no shortness of breath. Nothing aggravates the symptoms. Nothing relieves the symptoms. She has tried nothing for the symptoms. The treatment provided no relief.      Home Medications Prior to Admission medications   Medication Sig Start Date End Date Taking? Authorizing Provider  azithromycin (ZITHROMAX) 250 MG tablet Take 1 tablet (250 mg total) by mouth daily. Take first 2 tablets together, then 1 every day until finished. 12/13/19   Gareth Morgan, MD  lisinopril (PRINIVIL,ZESTRIL) 20 MG tablet Take 20 mg by mouth daily. 01/14/15   [provider]  PARoxetine (PAXIL) 20 MG tablet TAKE 1 TABLET IN THE MORNING ONCE A DAY ORALLY 01/21/15   [provider]  pravastatin (PRAVACHOL) 20 MG tablet Take 20 mg by mouth daily. 11/04/14   [provider]  traZODone (DESYREL) 100 MG tablet Take 100 mg by mouth daily as needed. 01/13/15   [provider]      Allergies    Patient has no known allergies.    Review of Systems   Review of Systems  Constitutional:  Negative for fever.  HENT:  Negative for facial swelling.   Eyes:  Negative for visual disturbance.  Respiratory:  Negative for shortness of breath.   Cardiovascular:  Negative for chest pain.  Gastrointestinal:  Negative for abdominal pain.  Musculoskeletal:  Negative for arthralgias.  Neurological:  Negative for headaches.  All other systems reviewed and are negative.  Physical Exam Updated Vital Signs BP (!) 147/93    Pulse 77   Temp 98.4 F (36.9 C)   Resp 16   Ht '5\' 6"'$  (1.676 m)   Wt 88.5 kg   SpO2 96%   BMI 31.47 kg/m  Physical Exam Vitals and nursing note reviewed.  Constitutional:      General: She is not in acute distress.    Appearance: She is well-developed.  HENT:     Head: Normocephalic and atraumatic.     Right Ear: Tympanic membrane normal.     Left Ear: Tympanic membrane normal.     Nose: Nose normal.  Eyes:     Conjunctiva/sclera: Conjunctivae normal.     Pupils: Pupils are equal, round, and reactive to light.     Comments: Normal appearance  Cardiovascular:     Rate and Rhythm: Normal rate and regular rhythm.     Pulses: Normal pulses.     Heart sounds: Normal heart sounds.  Pulmonary:     Effort: Pulmonary effort is normal. No respiratory distress.     Breath sounds: Normal breath sounds.  Abdominal:     General: Bowel sounds are normal. There is no distension.     Palpations: Abdomen is soft. There is no mass.     Tenderness: There is no abdominal tenderness. There is no guarding or rebound.  Genitourinary:    Comments: No CVA tenderness Musculoskeletal:        General: Normal range of motion.     Right wrist: Normal. No snuff  box tenderness.     Left wrist: Normal. No snuff box tenderness.     Cervical back: Normal and normal range of motion.     Thoracic back: Normal.     Lumbar back: Normal.     Right hip: Normal.     Left hip: Normal.     Right ankle: Normal.     Right Achilles Tendon: Normal.     Left ankle: Normal.     Left Achilles Tendon: Normal.  Skin:    General: Skin is warm and dry.     Capillary Refill: Capillary refill takes less than 2 seconds.     Findings: No rash.  Neurological:     General: No focal deficit present.     Mental Status: She is alert and oriented to person, place, and time.  Psychiatric:        Mood and Affect: Mood normal.        Behavior: Behavior normal.    ED Results / Procedures / Treatments   Labs (all labs  ordered are listed, but only abnormal results are displayed) Labs Reviewed - No data to display  EKG None  Radiology CT Head Wo Contrast  Result Date: 09/24/2021 CLINICAL DATA:  Facial trauma, penetrating.  Fall. EXAM: CT HEAD WITHOUT CONTRAST CT CERVICAL SPINE WITHOUT CONTRAST TECHNIQUE: Multidetector CT imaging of the head and cervical spine was performed following the standard protocol without intravenous contrast. Multiplanar CT image reconstructions of the cervical spine were also generated. RADIATION DOSE REDUCTION: This exam was performed according to the departmental dose-optimization program which includes automated exposure control, adjustment of the mA and/or kV according to patient size and/or use of iterative reconstruction technique. COMPARISON:  None Available. FINDINGS: CT HEAD FINDINGS Brain: No evidence of acute infarction, hemorrhage, hydrocephalus, extra-axial collection or mass lesion/mass effect. Prominence of the ventricles and sulci secondary to moderate cerebral volume loss. Low-attenuation of the periventricular white matter presumed chronic microvascular ischemic changes. Vascular: No hyperdense vessel or unexpected calcification. Skull: Mild soft tissue swelling about the left occipital region without evidence of calvarial fracture. Sinuses/Orbits: No acute finding. Other: None. CT CERVICAL SPINE FINDINGS Alignment: Straightening of the cervical spine. Skull base and vertebrae: No acute fracture. No primary bone lesion or focal pathologic process. Soft tissues and spinal canal: No prevertebral fluid or swelling. No visible canal hematoma. Disc levels: C2-C3: No significant spinal canal or neural foraminal stenosis. Moderate left facet joint arthropathy. C3-C4: Disc osteophyte complex with mild right neural foraminal stenosis. Mild left and moderate left facet joint arthropathy. C4-C5: Disc osteophyte complex with mild spinal canal stenosis. Moderate right neural foraminal  stenosis. C5-C6: Disc osteophyte complex with mild right neural foraminal stenosis. Moderate right facet joint arthropathy. C6-C7: Disc osteophyte complex without significant spinal canal stenosis. C7-T1:  No significant spinal canal or neural foraminal stenosis. Upper chest: Negative. Other: None IMPRESSION: 1.  No acute intracranial abnormality. 2. Moderate cerebral atrophy and chronic microvascular ischemic changes of the white matter. 3. Soft tissue swelling in the left occipital region without evidence of calvarial fracture. 4.  No acute cervical spine fracture or traumatic subluxation. 5. Advanced multilevel degenerative disc disease of the cervical spine with multilevel facet joint arthropathy and neural foraminal stenosis as detailed above. Electronically Signed   By: Keane Police D.O.   On: 09/24/2021 00:03   CT Cervical Spine Wo Contrast  Result Date: 09/24/2021 CLINICAL DATA:  Facial trauma, penetrating.  Fall. EXAM: CT HEAD WITHOUT CONTRAST CT CERVICAL SPINE WITHOUT CONTRAST  TECHNIQUE: Multidetector CT imaging of the head and cervical spine was performed following the standard protocol without intravenous contrast. Multiplanar CT image reconstructions of the cervical spine were also generated. RADIATION DOSE REDUCTION: This exam was performed according to the departmental dose-optimization program which includes automated exposure control, adjustment of the mA and/or kV according to patient size and/or use of iterative reconstruction technique. COMPARISON:  None Available. FINDINGS: CT HEAD FINDINGS Brain: No evidence of acute infarction, hemorrhage, hydrocephalus, extra-axial collection or mass lesion/mass effect. Prominence of the ventricles and sulci secondary to moderate cerebral volume loss. Low-attenuation of the periventricular white matter presumed chronic microvascular ischemic changes. Vascular: No hyperdense vessel or unexpected calcification. Skull: Mild soft tissue swelling about the left  occipital region without evidence of calvarial fracture. Sinuses/Orbits: No acute finding. Other: None. CT CERVICAL SPINE FINDINGS Alignment: Straightening of the cervical spine. Skull base and vertebrae: No acute fracture. No primary bone lesion or focal pathologic process. Soft tissues and spinal canal: No prevertebral fluid or swelling. No visible canal hematoma. Disc levels: C2-C3: No significant spinal canal or neural foraminal stenosis. Moderate left facet joint arthropathy. C3-C4: Disc osteophyte complex with mild right neural foraminal stenosis. Mild left and moderate left facet joint arthropathy. C4-C5: Disc osteophyte complex with mild spinal canal stenosis. Moderate right neural foraminal stenosis. C5-C6: Disc osteophyte complex with mild right neural foraminal stenosis. Moderate right facet joint arthropathy. C6-C7: Disc osteophyte complex without significant spinal canal stenosis. C7-T1:  No significant spinal canal or neural foraminal stenosis. Upper chest: Negative. Other: None IMPRESSION: 1.  No acute intracranial abnormality. 2. Moderate cerebral atrophy and chronic microvascular ischemic changes of the white matter. 3. Soft tissue swelling in the left occipital region without evidence of calvarial fracture. 4.  No acute cervical spine fracture or traumatic subluxation. 5. Advanced multilevel degenerative disc disease of the cervical spine with multilevel facet joint arthropathy and neural foraminal stenosis as detailed above. Electronically Signed   By: Keane Police D.O.   On: 09/24/2021 00:03    Procedures Procedures    Medications Ordered in ED Medications  Tdap (BOOSTRIX) injection 0.5 mL (0.5 mLs Intramuscular Given 09/24/21 0116)    ED Course/ Medical Decision Making/ A&P                           Medical Decision Making Fall at home, no complaints   Problems Addressed: Fall, initial encounter: acute illness or injury    Details: ct head and C spine negative  Amount and/or  Complexity of Data Reviewed Independent Historian: EMS    Details: see above External Data Reviewed: notes.    Details: previous notes reviewed Radiology: ordered and independent interpretation performed.    Details: negative CT head and C spine for fracture or acute injury  Risk Prescription drug management. Risk Details: Patient fell at home.  Exam is normal.  Vitals are normal.  No complaints.  Stable for discharge with close follow up.      Final Clinical Impression(s) / ED Diagnoses Final diagnoses:  Fall, initial encounter  Injury of head, initial encounter   Return for intractable cough, coughing up blood, fevers > 100.4 unrelieved by medication, shortness of breath, intractable vomiting, chest pain, shortness of breath, weakness, numbness, changes in speech, facial asymmetry, abdominal pain, passing out, Inability to tolerate liquids or food, cough, altered mental status or any concerns. No signs of systemic illness or infection. The patient is nontoxic-appearing on exam and vital signs  are within normal limits.  I have reviewed the triage vital signs and the nursing notes. Pertinent labs & imaging results that were available during my care of the patient were reviewed by me and considered in my medical decision making (see chart for details). After history, exam, and medical workup I feel the patient has been appropriately medically screened and is safe for discharge home. Pertinent diagnoses were discussed with the patient. Patient was given return precautions. Rx / DC Orders ED Discharge Orders     None         Sharell Hilmer, MD 09/24/21 9611

## 2021-09-28 DIAGNOSIS — F1020 Alcohol dependence, uncomplicated: Secondary | ICD-10-CM | POA: Diagnosis not present

## 2021-09-28 DIAGNOSIS — W19XXXA Unspecified fall, initial encounter: Secondary | ICD-10-CM | POA: Diagnosis not present

## 2021-09-28 DIAGNOSIS — G319 Degenerative disease of nervous system, unspecified: Secondary | ICD-10-CM | POA: Diagnosis not present

## 2021-09-28 DIAGNOSIS — I10 Essential (primary) hypertension: Secondary | ICD-10-CM | POA: Diagnosis not present

## 2021-09-28 DIAGNOSIS — E871 Hypo-osmolality and hyponatremia: Secondary | ICD-10-CM | POA: Diagnosis not present

## 2021-09-28 DIAGNOSIS — R945 Abnormal results of liver function studies: Secondary | ICD-10-CM | POA: Diagnosis not present

## 2021-10-05 DIAGNOSIS — E871 Hypo-osmolality and hyponatremia: Secondary | ICD-10-CM | POA: Diagnosis not present

## 2021-10-07 DIAGNOSIS — M5451 Vertebrogenic low back pain: Secondary | ICD-10-CM | POA: Diagnosis not present

## 2021-10-07 DIAGNOSIS — M542 Cervicalgia: Secondary | ICD-10-CM | POA: Diagnosis not present

## 2021-10-12 DIAGNOSIS — M47812 Spondylosis without myelopathy or radiculopathy, cervical region: Secondary | ICD-10-CM | POA: Diagnosis not present

## 2021-10-12 DIAGNOSIS — M47816 Spondylosis without myelopathy or radiculopathy, lumbar region: Secondary | ICD-10-CM | POA: Diagnosis not present

## 2021-10-29 DIAGNOSIS — E871 Hypo-osmolality and hyponatremia: Secondary | ICD-10-CM | POA: Diagnosis not present

## 2021-11-03 DIAGNOSIS — E871 Hypo-osmolality and hyponatremia: Secondary | ICD-10-CM | POA: Diagnosis not present

## 2021-11-05 DIAGNOSIS — M47812 Spondylosis without myelopathy or radiculopathy, cervical region: Secondary | ICD-10-CM | POA: Diagnosis not present

## 2021-12-31 DIAGNOSIS — E871 Hypo-osmolality and hyponatremia: Secondary | ICD-10-CM | POA: Diagnosis not present

## 2021-12-31 DIAGNOSIS — I959 Hypotension, unspecified: Secondary | ICD-10-CM | POA: Diagnosis not present

## 2021-12-31 DIAGNOSIS — R531 Weakness: Secondary | ICD-10-CM | POA: Diagnosis not present

## 2021-12-31 DIAGNOSIS — R252 Cramp and spasm: Secondary | ICD-10-CM | POA: Diagnosis not present

## 2021-12-31 DIAGNOSIS — G319 Degenerative disease of nervous system, unspecified: Secondary | ICD-10-CM | POA: Diagnosis not present

## 2022-01-14 DIAGNOSIS — R5381 Other malaise: Secondary | ICD-10-CM | POA: Diagnosis not present

## 2022-01-14 DIAGNOSIS — R531 Weakness: Secondary | ICD-10-CM | POA: Diagnosis not present

## 2022-01-14 DIAGNOSIS — F1020 Alcohol dependence, uncomplicated: Secondary | ICD-10-CM | POA: Diagnosis not present

## 2022-01-14 DIAGNOSIS — F3341 Major depressive disorder, recurrent, in partial remission: Secondary | ICD-10-CM | POA: Diagnosis not present

## 2022-01-14 DIAGNOSIS — E871 Hypo-osmolality and hyponatremia: Secondary | ICD-10-CM | POA: Diagnosis not present

## 2022-01-26 DIAGNOSIS — R5381 Other malaise: Secondary | ICD-10-CM | POA: Diagnosis not present

## 2022-01-26 DIAGNOSIS — E871 Hypo-osmolality and hyponatremia: Secondary | ICD-10-CM | POA: Diagnosis not present

## 2022-01-26 DIAGNOSIS — F1020 Alcohol dependence, uncomplicated: Secondary | ICD-10-CM | POA: Diagnosis not present

## 2022-02-10 DIAGNOSIS — F17210 Nicotine dependence, cigarettes, uncomplicated: Secondary | ICD-10-CM | POA: Diagnosis not present

## 2022-02-10 DIAGNOSIS — F1020 Alcohol dependence, uncomplicated: Secondary | ICD-10-CM | POA: Diagnosis not present

## 2022-02-10 DIAGNOSIS — R238 Other skin changes: Secondary | ICD-10-CM | POA: Diagnosis not present

## 2022-02-10 DIAGNOSIS — E871 Hypo-osmolality and hyponatremia: Secondary | ICD-10-CM | POA: Diagnosis not present

## 2022-02-10 DIAGNOSIS — M542 Cervicalgia: Secondary | ICD-10-CM | POA: Diagnosis not present

## 2022-02-10 DIAGNOSIS — F3341 Major depressive disorder, recurrent, in partial remission: Secondary | ICD-10-CM | POA: Diagnosis not present

## 2022-02-10 DIAGNOSIS — I10 Essential (primary) hypertension: Secondary | ICD-10-CM | POA: Diagnosis not present

## 2022-02-17 DIAGNOSIS — F1020 Alcohol dependence, uncomplicated: Secondary | ICD-10-CM | POA: Diagnosis not present

## 2022-02-17 DIAGNOSIS — E7800 Pure hypercholesterolemia, unspecified: Secondary | ICD-10-CM | POA: Diagnosis not present

## 2022-02-17 DIAGNOSIS — E871 Hypo-osmolality and hyponatremia: Secondary | ICD-10-CM | POA: Diagnosis not present

## 2022-02-17 DIAGNOSIS — M542 Cervicalgia: Secondary | ICD-10-CM | POA: Diagnosis not present

## 2022-02-17 DIAGNOSIS — I10 Essential (primary) hypertension: Secondary | ICD-10-CM | POA: Diagnosis not present

## 2022-03-10 DIAGNOSIS — F17210 Nicotine dependence, cigarettes, uncomplicated: Secondary | ICD-10-CM | POA: Diagnosis not present

## 2022-03-10 DIAGNOSIS — I10 Essential (primary) hypertension: Secondary | ICD-10-CM | POA: Diagnosis not present

## 2022-03-10 DIAGNOSIS — F1020 Alcohol dependence, uncomplicated: Secondary | ICD-10-CM | POA: Diagnosis not present

## 2022-05-12 DIAGNOSIS — Z03818 Encounter for observation for suspected exposure to other biological agents ruled out: Secondary | ICD-10-CM | POA: Diagnosis not present

## 2022-05-12 DIAGNOSIS — J441 Chronic obstructive pulmonary disease with (acute) exacerbation: Secondary | ICD-10-CM | POA: Diagnosis not present

## 2022-06-09 DIAGNOSIS — M25511 Pain in right shoulder: Secondary | ICD-10-CM | POA: Diagnosis not present

## 2022-06-21 DIAGNOSIS — M47812 Spondylosis without myelopathy or radiculopathy, cervical region: Secondary | ICD-10-CM | POA: Diagnosis not present

## 2022-06-29 DIAGNOSIS — M47812 Spondylosis without myelopathy or radiculopathy, cervical region: Secondary | ICD-10-CM | POA: Diagnosis not present

## 2022-07-12 DIAGNOSIS — G319 Degenerative disease of nervous system, unspecified: Secondary | ICD-10-CM | POA: Diagnosis not present

## 2022-07-12 DIAGNOSIS — J449 Chronic obstructive pulmonary disease, unspecified: Secondary | ICD-10-CM | POA: Diagnosis not present

## 2022-07-12 DIAGNOSIS — F3341 Major depressive disorder, recurrent, in partial remission: Secondary | ICD-10-CM | POA: Diagnosis not present

## 2022-07-12 DIAGNOSIS — F17210 Nicotine dependence, cigarettes, uncomplicated: Secondary | ICD-10-CM | POA: Diagnosis not present

## 2022-07-12 DIAGNOSIS — F1020 Alcohol dependence, uncomplicated: Secondary | ICD-10-CM | POA: Diagnosis not present

## 2022-07-12 DIAGNOSIS — I10 Essential (primary) hypertension: Secondary | ICD-10-CM | POA: Diagnosis not present

## 2022-07-20 DIAGNOSIS — M47812 Spondylosis without myelopathy or radiculopathy, cervical region: Secondary | ICD-10-CM | POA: Diagnosis not present

## 2022-08-03 DIAGNOSIS — M47812 Spondylosis without myelopathy or radiculopathy, cervical region: Secondary | ICD-10-CM | POA: Diagnosis not present

## 2022-08-26 DIAGNOSIS — M47812 Spondylosis without myelopathy or radiculopathy, cervical region: Secondary | ICD-10-CM | POA: Diagnosis not present

## 2022-08-27 IMAGING — CT CT CERVICAL SPINE W/O CM
3 of 4 series · 12 of 33 positions shown, 14 images · non-contrast
Comparison: None Available.

CLINICAL DATA: Facial trauma, penetrating.  Fall.



[Series 5: orthogonal bone · axial · 0.30mm/px · z∈[-293,-173]mm · 4 of 97 slices shown, 5 images]
[im 14/97  soft-tissue]
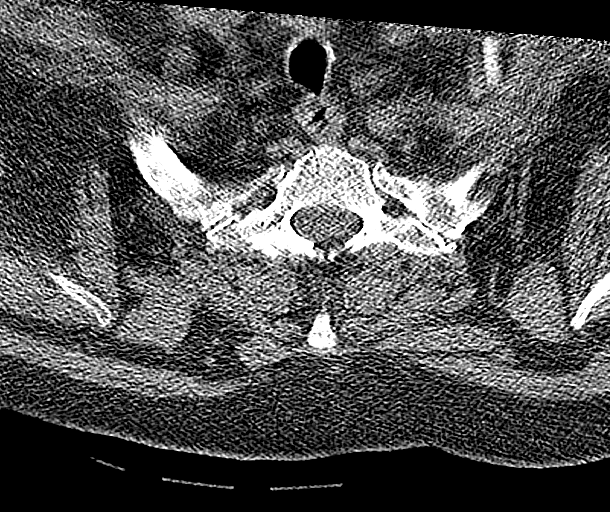
[im 14/97  bone]
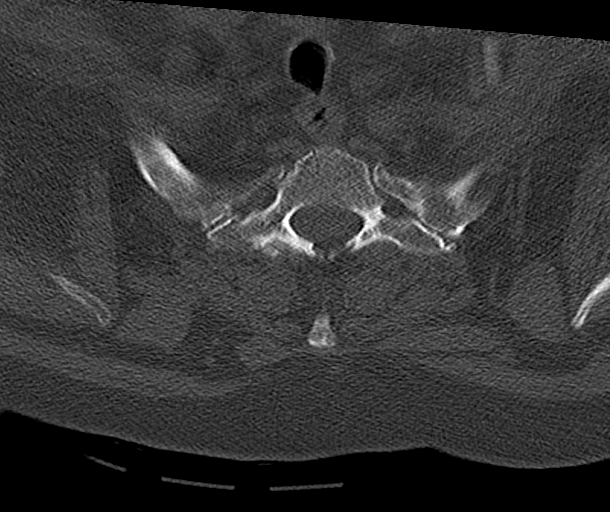
[im 42/97  bone]
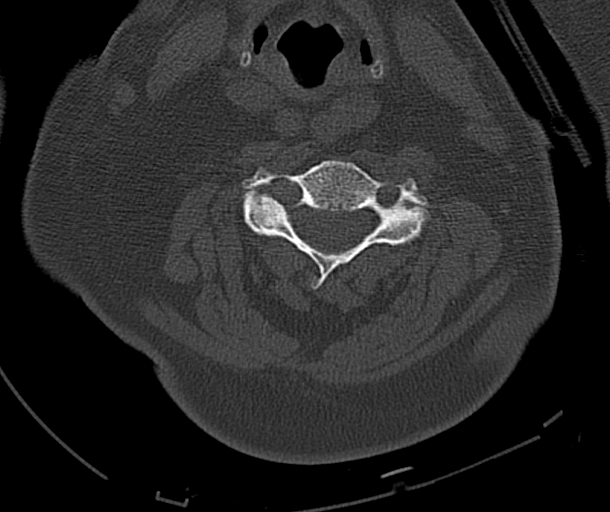
[im 55/97  bone]
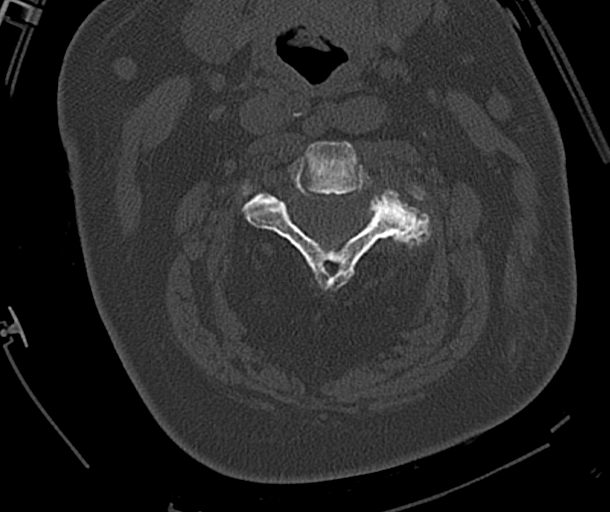
[im 83/97  bone]
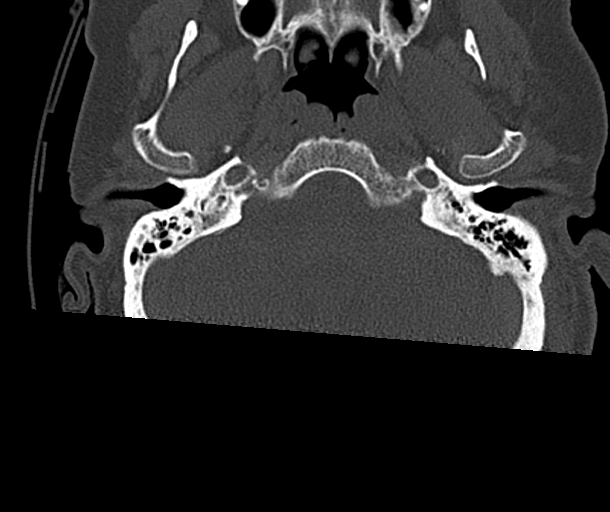

[Series 6: coronal bone · coronal · 0.29mm/px · 3 of 44 slices shown]
[im 9/44  bone]
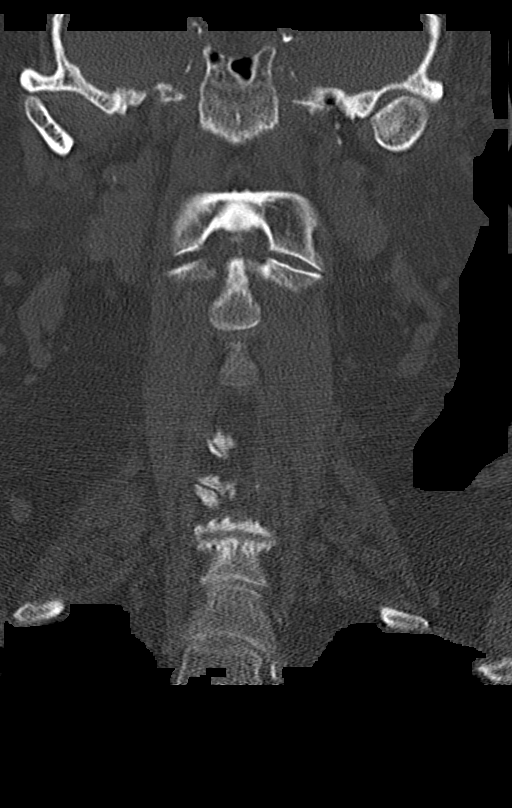
[im 18/44  bone]
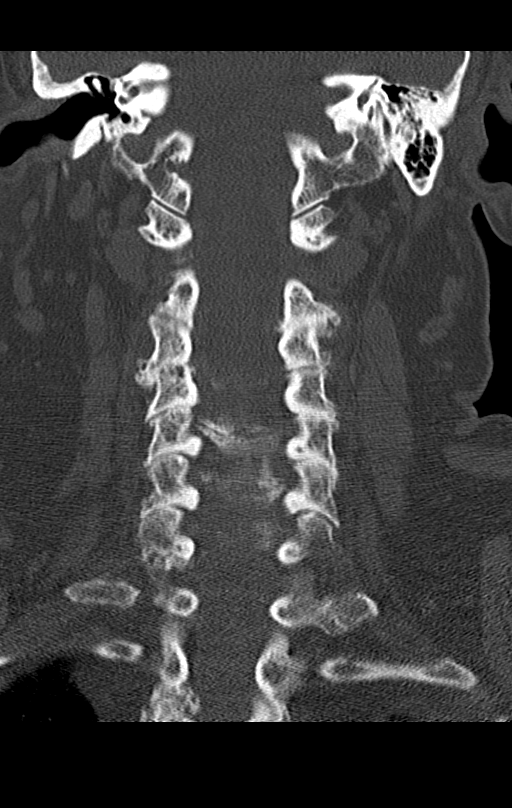
[im 26/44  bone]
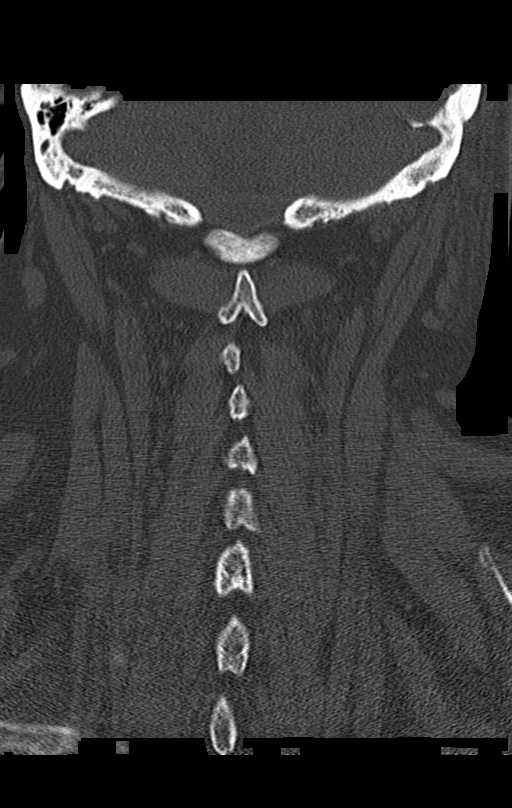

[Series 7: sagittal bone · sagittal · 0.33mm/px · 5 of 51 slices shown, 6 images]
[im 17/51  bone]
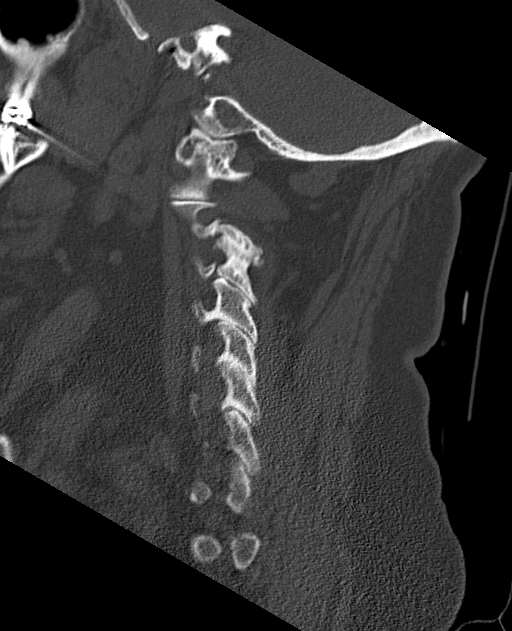
[im 21/51  bone]
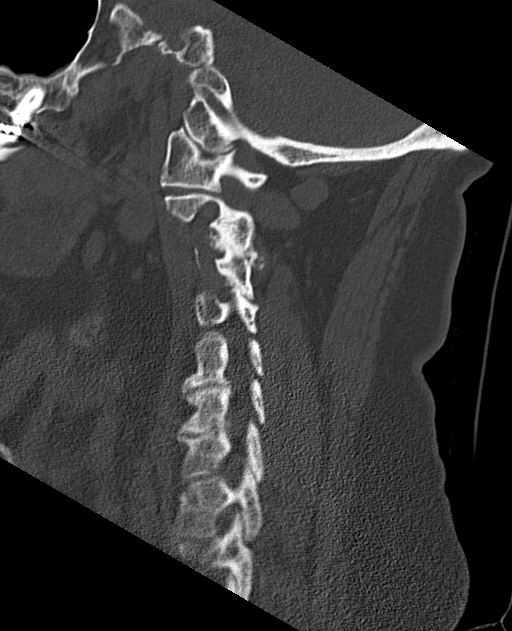
[im 26/51  soft-tissue]
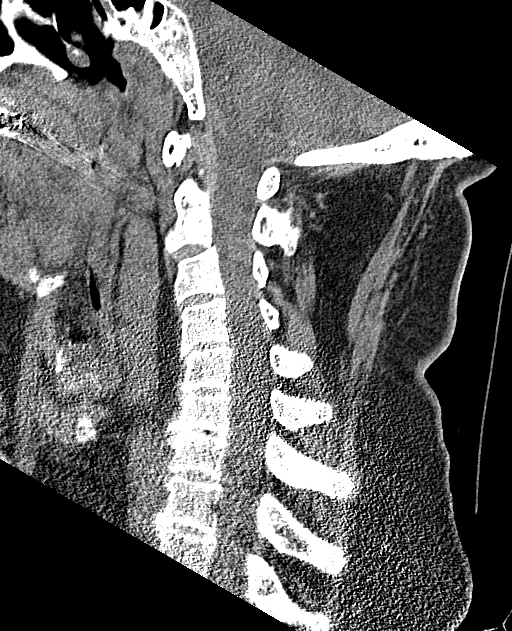
[im 26/51  bone]
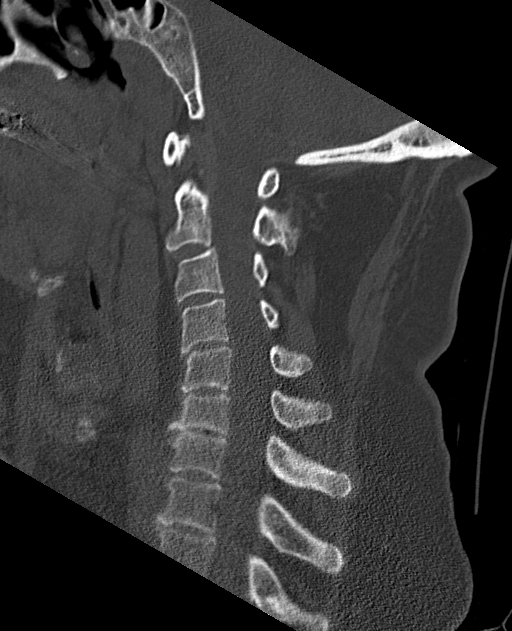
[im 30/51  bone]
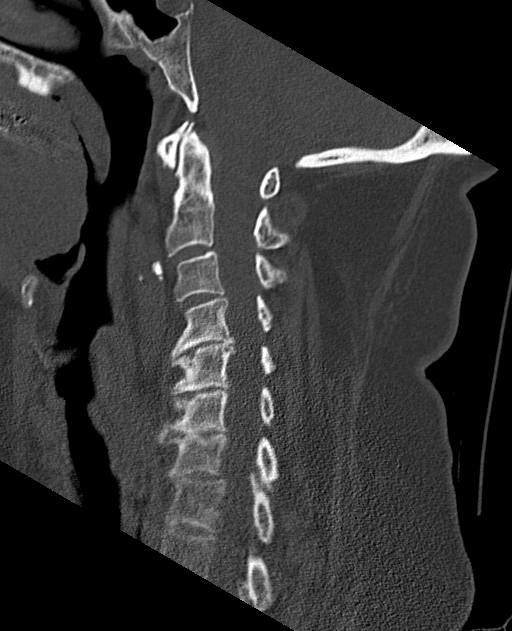
[im 34/51  bone]
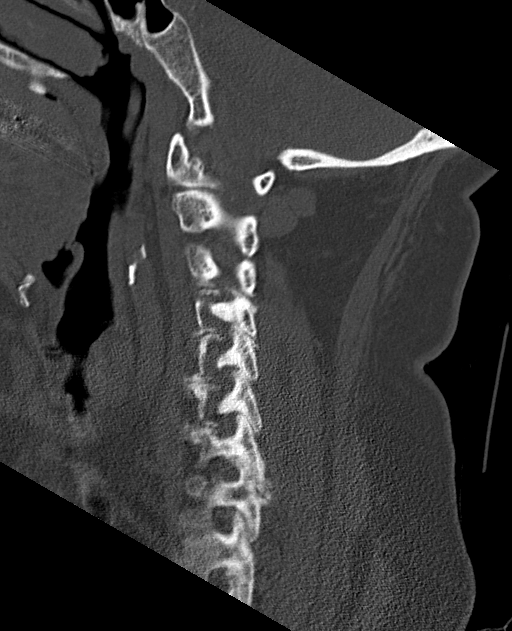

[12 of 33 positions shown; findings below may reference images not displayed]

FINDINGS: CT HEAD FINDINGS

Brain: No evidence of acute infarction, hemorrhage, hydrocephalus,
extra-axial collection or mass lesion/mass effect. Prominence of the
ventricles and sulci secondary to moderate cerebral volume loss.
Low-attenuation of the periventricular white matter presumed chronic
microvascular ischemic changes.

Vascular: No hyperdense vessel or unexpected calcification.

Skull: Mild soft tissue swelling about the left occipital region
without evidence of calvarial fracture.

Sinuses/Orbits: No acute finding.

Other: None.

CT CERVICAL SPINE FINDINGS

Alignment: Straightening of the cervical spine.

Skull base and vertebrae: No acute fracture. No primary bone lesion
or focal pathologic process.

Soft tissues and spinal canal: No prevertebral fluid or swelling. No
visible canal hematoma.

Disc levels:

C2-C3: No significant spinal canal or neural foraminal stenosis.
Moderate left facet joint arthropathy.

C3-C4: Disc osteophyte complex with mild right neural foraminal
stenosis. Mild left and moderate left facet joint arthropathy.

C4-C5: Disc osteophyte complex with mild spinal canal stenosis.
Moderate right neural foraminal stenosis.

C5-C6: Disc osteophyte complex with mild right neural foraminal
stenosis. Moderate right facet joint arthropathy.

C6-C7: Disc osteophyte complex without significant spinal canal
stenosis.

C7-T1:  No significant spinal canal or neural foraminal stenosis.

Upper chest: Negative.

Other: None
IMPRESSION: 1.  No acute intracranial abnormality.

2. Moderate cerebral atrophy and chronic microvascular ischemic
changes of the white matter.

3. Soft tissue swelling in the left occipital region without
evidence of calvarial fracture.

4.  No acute cervical spine fracture or traumatic subluxation.

5. Advanced multilevel degenerative disc disease of the cervical
spine with multilevel facet joint arthropathy and neural foraminal
stenosis as detailed above.

## 2022-09-03 DIAGNOSIS — M79674 Pain in right toe(s): Secondary | ICD-10-CM | POA: Diagnosis not present

## 2022-09-03 DIAGNOSIS — M79675 Pain in left toe(s): Secondary | ICD-10-CM | POA: Diagnosis not present

## 2022-09-22 DIAGNOSIS — J449 Chronic obstructive pulmonary disease, unspecified: Secondary | ICD-10-CM | POA: Diagnosis not present

## 2022-09-22 DIAGNOSIS — J40 Bronchitis, not specified as acute or chronic: Secondary | ICD-10-CM | POA: Diagnosis not present

## 2022-09-22 DIAGNOSIS — Z03818 Encounter for observation for suspected exposure to other biological agents ruled out: Secondary | ICD-10-CM | POA: Diagnosis not present

## 2022-09-29 DIAGNOSIS — J441 Chronic obstructive pulmonary disease with (acute) exacerbation: Secondary | ICD-10-CM | POA: Diagnosis not present

## 2022-09-29 DIAGNOSIS — I10 Essential (primary) hypertension: Secondary | ICD-10-CM | POA: Diagnosis not present

## 2022-10-18 ENCOUNTER — Other Ambulatory Visit: Payer: Self-pay | Admitting: *Deleted

## 2022-10-18 DIAGNOSIS — M79673 Pain in unspecified foot: Secondary | ICD-10-CM

## 2022-11-01 ENCOUNTER — Ambulatory Visit (INDEPENDENT_AMBULATORY_CARE_PROVIDER_SITE_OTHER): Payer: No Typology Code available for payment source | Admitting: Surgery

## 2022-11-01 ENCOUNTER — Ambulatory Visit (HOSPITAL_COMMUNITY)
Admission: RE | Admit: 2022-11-01 | Discharge: 2022-11-01 | Disposition: A | Discharge status: Home / Self Care | Payer: No Typology Code available for payment source | Source: Ambulatory Visit | Attending: Surgery | Admitting: Surgery

## 2022-11-01 ENCOUNTER — Encounter: Payer: Self-pay | Admitting: Surgery

## 2022-11-01 VITALS — BP 164/94 | HR 66 | Temp 97.9°F | Resp 20 | Ht 66.0 in | Wt 192.8 lb

## 2022-11-01 DIAGNOSIS — I739 Peripheral vascular disease, unspecified: Secondary | ICD-10-CM | POA: Diagnosis not present

## 2022-11-01 DIAGNOSIS — M79673 Pain in unspecified foot: Secondary | ICD-10-CM | POA: Diagnosis not present

## 2022-11-01 LAB — VAS US ABI WITH/WO TBI
Left ABI: 0.99
Right ABI: 0.8

## 2022-11-01 NOTE — Progress Notes (Signed)
[facility]  Patient name: [name] [name] MRN: [id number] DOB: [birth year] Sex: female   REQUESTING PROVIDER:    Dr. Fransisca Connors   REASON FOR CONSULT:    PAD - chronic foot pain  HISTORY OF PRESENT ILLNESS:   [name] [name] is a 67 y.o. female, who is referred for leg pain.  This has been a chronic problem for her.  She describes having stabbing pain in both feet.  She denies any claudication symptoms.  She has no limitations from her legs with her walking.  Her biggest complaint today is that of dizziness and frequent falls.  She thought this would get better when she quit drinking but it has not.  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  She is medically managed for hypertension with an ACE inhibitor.  PAST MEDICAL HISTORY    Past Medical History:  Diagnosis Date   Buttock pain    "right"- at present   COPD (chronic obstructive pulmonary disease) (HCC)    Depression    Hypertension      FAMILY HISTORY   History reviewed. No pertinent family history.  SOCIAL HISTORY:   Social History   Socioeconomic History   Marital status: Married    Spouse name: Not on file   Number of children: Not on file   Years of education: Not on file   Highest education level: Not on file  Occupational History   Not on file  Tobacco Use   Smoking status: Former    Packs/day: 0.50    Years: 10.00    Additional pack years: 0.00    Total pack years: 5.00    Types: Cigarettes    Quit date: 09/2022    Years since quitting: 0.1   Smokeless tobacco: Never  Substance and Sexual Activity   Alcohol use: Yes    Comment: occ.    Drug use: No   Sexual activity: Yes  Other Topics Concern   Not on file  Social History Narrative   Not on file   Social Determinants of Health   Financial Resource Strain: Not on file  Food Insecurity: Not on file  Transportation Needs: Not on file  Physical Activity:  Not on file  Stress: Not on file  Social Connections: Not on file  Intimate Partner Violence: Not on file    ALLERGIES:    Allergies  Allergen Reactions   Asa [Aspirin] Itching   Latex Itching   Morphine Itching    CURRENT MEDICATIONS:    Current Outpatient Medications  Medication Sig Dispense Refill   albuterol (VENTOLIN HFA) 108 (90 Base) MCG/ACT inhaler Inhale 1 puff into the lungs every 4 (four) hours.     lisinopril (PRINIVIL,ZESTRIL) 20 MG tablet Take 20 mg by mouth daily.  11   traZODone (DESYREL) 100 MG tablet Take 100 mg by mouth daily as needed.  2   TRELEGY ELLIPTA 200-62.5-25 MCG/ACT AEPB Inhale 1 puff into the lungs daily.     No current facility-administered medications for this visit.    REVIEW OF SYSTEMS:   [X]  denotes positive finding, [ ]  denotes negative finding Cardiac  Comments:  Chest pain or chest pressure:    Shortness of breath upon exertion:    Short of breath when lying flat:    Irregular heart rhythm:        Vascular    Pain in calf, thigh, or hip brought on by ambulation:    Pain in feet at  night that wakes you up from your sleep:     Blood clot in your veins:    Leg swelling:         Pulmonary    Oxygen at home:    Productive cough:     Wheezing:         Neurologic    Sudden weakness in arms or legs:     Sudden numbness in arms or legs:     Sudden onset of difficulty speaking or slurred speech:    Temporary loss of vision in one eye:     Problems with dizziness:         Gastrointestinal    Blood in stool:      Vomited blood:         Genitourinary    Burning when urinating:     Blood in urine:        Psychiatric    Major depression:         Hematologic    Bleeding problems:    Problems with blood clotting too easily:        Skin    Rashes or ulcers:        Constitutional    Fever or chills:     PHYSICAL EXAM:   Vitals:   11/01/22 0940  BP: (!) 164/94  Pulse: 66  Resp: 20  Temp: 97.9 F (36.6 C)  SpO2:  95%  Weight: 192 lb 12.8 oz (87.5 kg)  Height: 5\' 6"  (1.676 m)    GENERAL: The patient is a well-nourished female, in no acute distress. The vital signs are documented above. CARDIAC: There is a regular rate and rhythm.  VASCULAR: Palpable left dorsalis pedis pulse, nonpalpable right.  No carotid bruits. PULMONARY: Nonlabored respirations MUSCULOSKELETAL: There are no major deformities or cyanosis. NEUROLOGIC: No focal weakness or paresthesias are detected. SKIN: There are no ulcers or rashes noted. PSYCHIATRIC: The patient has a normal affect.  STUDIES:   I have reviewed the following:  +-------+-----------+-----------+------------+------------+  ABI/TBIToday's ABIToday's TBIPrevious ABIPrevious TBI  +-------+-----------+-----------+------------+------------+  Right 0.80       0.51                                 +-------+-----------+-----------+------------+------------+  Left  0.99       0.86                                 +-------+-----------+-----------+------------+------------+  Right toe pressure: 91, monophasic waveforms Left toe pressure: 153, triphasic waveforms  ASSESSMENT and PLAN   PAD: The patient has issues on the right leg however the ABI is 0.8.  She has a palpable pedal pulse on the left.  Her biggest issue is a feeling of stabbing pains in both feet.  This sounds most likely to be neuropathy.  I think she would benefit from a trial of gabapentin to see if this would help.  She does have underlying PAD which is not causing her significant symptoms.  For prevention, she should be taking a baby aspirin as well as a statin.  I have her scheduled for follow-up with me in 1 year with repeat ABIs.  I will defer to Dr. Nehemiah Settle, her primary care physician regarding medication changes  Dizziness: I recommended that she see neurology for further evaluation.  I did not hear carotid bruits and so I did  not order a carotid duplex today.   Charlena Cross,  MD, FACS [facility] [phone] Pager [phone]

## 2022-11-16 ENCOUNTER — Other Ambulatory Visit: Payer: Self-pay

## 2022-11-16 DIAGNOSIS — I739 Peripheral vascular disease, unspecified: Secondary | ICD-10-CM

## 2022-12-17 DIAGNOSIS — U071 COVID-19: Secondary | ICD-10-CM | POA: Diagnosis not present

## 2022-12-17 DIAGNOSIS — J029 Acute pharyngitis, unspecified: Secondary | ICD-10-CM | POA: Diagnosis not present

## 2022-12-17 DIAGNOSIS — J0100 Acute maxillary sinusitis, unspecified: Secondary | ICD-10-CM | POA: Diagnosis not present

## 2023-01-05 DIAGNOSIS — I10 Essential (primary) hypertension: Secondary | ICD-10-CM | POA: Diagnosis not present

## 2023-01-05 DIAGNOSIS — Z8616 Personal history of COVID-19: Secondary | ICD-10-CM | POA: Diagnosis not present

## 2023-01-05 DIAGNOSIS — J449 Chronic obstructive pulmonary disease, unspecified: Secondary | ICD-10-CM | POA: Diagnosis not present

## 2023-01-05 DIAGNOSIS — I739 Peripheral vascular disease, unspecified: Secondary | ICD-10-CM | POA: Diagnosis not present

## 2023-01-22 DIAGNOSIS — M542 Cervicalgia: Secondary | ICD-10-CM | POA: Diagnosis not present

## 2023-01-22 DIAGNOSIS — M79601 Pain in right arm: Secondary | ICD-10-CM | POA: Diagnosis not present

## 2023-01-26 DIAGNOSIS — R059 Cough, unspecified: Secondary | ICD-10-CM | POA: Diagnosis not present

## 2023-01-26 DIAGNOSIS — Z03818 Encounter for observation for suspected exposure to other biological agents ruled out: Secondary | ICD-10-CM | POA: Diagnosis not present

## 2023-01-26 DIAGNOSIS — J441 Chronic obstructive pulmonary disease with (acute) exacerbation: Secondary | ICD-10-CM | POA: Diagnosis not present

## 2023-01-26 DIAGNOSIS — I739 Peripheral vascular disease, unspecified: Secondary | ICD-10-CM | POA: Diagnosis not present

## 2023-02-18 DIAGNOSIS — H524 Presbyopia: Secondary | ICD-10-CM | POA: Diagnosis not present

## 2023-02-18 DIAGNOSIS — H5203 Hypermetropia, bilateral: Secondary | ICD-10-CM | POA: Diagnosis not present

## 2023-06-09 ENCOUNTER — Other Ambulatory Visit: Payer: Self-pay | Admitting: Family Medicine

## 2023-06-09 DIAGNOSIS — Z780 Asymptomatic menopausal state: Secondary | ICD-10-CM

## 2023-06-14 ENCOUNTER — Other Ambulatory Visit: Payer: Self-pay | Admitting: Family Medicine

## 2023-06-14 DIAGNOSIS — Z1231 Encounter for screening mammogram for malignant neoplasm of breast: Secondary | ICD-10-CM

## 2023-07-05 DIAGNOSIS — E039 Hypothyroidism, unspecified: Secondary | ICD-10-CM | POA: Diagnosis not present

## 2023-07-05 DIAGNOSIS — F419 Anxiety disorder, unspecified: Secondary | ICD-10-CM | POA: Diagnosis not present

## 2023-07-05 DIAGNOSIS — Z131 Encounter for screening for diabetes mellitus: Secondary | ICD-10-CM | POA: Diagnosis not present

## 2023-07-05 DIAGNOSIS — Z0000 Encounter for general adult medical examination without abnormal findings: Secondary | ICD-10-CM | POA: Diagnosis not present

## 2023-07-05 DIAGNOSIS — J449 Chronic obstructive pulmonary disease, unspecified: Secondary | ICD-10-CM | POA: Diagnosis not present

## 2023-07-05 DIAGNOSIS — Z1331 Encounter for screening for depression: Secondary | ICD-10-CM | POA: Diagnosis not present

## 2023-07-05 DIAGNOSIS — G629 Polyneuropathy, unspecified: Secondary | ICD-10-CM | POA: Diagnosis not present

## 2023-07-05 DIAGNOSIS — I10 Essential (primary) hypertension: Secondary | ICD-10-CM | POA: Diagnosis not present

## 2023-07-05 DIAGNOSIS — I739 Peripheral vascular disease, unspecified: Secondary | ICD-10-CM | POA: Diagnosis not present

## 2023-07-18 DIAGNOSIS — I959 Hypotension, unspecified: Secondary | ICD-10-CM | POA: Diagnosis not present

## 2023-07-18 DIAGNOSIS — R42 Dizziness and giddiness: Secondary | ICD-10-CM | POA: Diagnosis not present

## 2023-07-20 ENCOUNTER — Other Ambulatory Visit: Payer: Self-pay | Admitting: Family Medicine

## 2023-07-20 DIAGNOSIS — R2689 Other abnormalities of gait and mobility: Secondary | ICD-10-CM

## 2023-07-20 DIAGNOSIS — M4802 Spinal stenosis, cervical region: Secondary | ICD-10-CM

## 2023-08-18 DIAGNOSIS — E039 Hypothyroidism, unspecified: Secondary | ICD-10-CM | POA: Diagnosis not present

## 2024-01-05 DIAGNOSIS — E039 Hypothyroidism, unspecified: Secondary | ICD-10-CM | POA: Diagnosis not present

## 2024-01-05 DIAGNOSIS — F322 Major depressive disorder, single episode, severe without psychotic features: Secondary | ICD-10-CM | POA: Diagnosis not present

## 2024-01-05 DIAGNOSIS — Z5181 Encounter for therapeutic drug level monitoring: Secondary | ICD-10-CM | POA: Diagnosis not present

## 2024-01-05 DIAGNOSIS — I10 Essential (primary) hypertension: Secondary | ICD-10-CM | POA: Diagnosis not present

## 2024-01-05 DIAGNOSIS — M79643 Pain in unspecified hand: Secondary | ICD-10-CM | POA: Diagnosis not present

## 2024-01-05 DIAGNOSIS — E7800 Pure hypercholesterolemia, unspecified: Secondary | ICD-10-CM | POA: Diagnosis not present

## 2024-01-26 DIAGNOSIS — H43393 Other vitreous opacities, bilateral: Secondary | ICD-10-CM | POA: Diagnosis not present

## 2024-01-30 DIAGNOSIS — I10 Essential (primary) hypertension: Secondary | ICD-10-CM | POA: Diagnosis not present

## 2024-01-30 DIAGNOSIS — R7303 Prediabetes: Secondary | ICD-10-CM | POA: Diagnosis not present

## 2024-01-30 DIAGNOSIS — M79673 Pain in unspecified foot: Secondary | ICD-10-CM | POA: Diagnosis not present

## 2024-02-07 ENCOUNTER — Encounter: Payer: Self-pay | Admitting: Podiatry

## 2024-02-07 ENCOUNTER — Ambulatory Visit

## 2024-02-07 ENCOUNTER — Ambulatory Visit: Admitting: Podiatry

## 2024-02-07 VITALS — Ht 66.0 in | Wt 192.8 lb

## 2024-02-07 DIAGNOSIS — M79671 Pain in right foot: Secondary | ICD-10-CM | POA: Diagnosis not present

## 2024-02-07 DIAGNOSIS — I739 Peripheral vascular disease, unspecified: Secondary | ICD-10-CM

## 2024-02-07 DIAGNOSIS — M7661 Achilles tendinitis, right leg: Secondary | ICD-10-CM

## 2024-02-07 MED ORDER — METHYLPREDNISOLONE 4 MG PO TBPK: ORAL_TABLET | ORAL | 0 refills | Status: AC

## 2024-02-07 NOTE — Progress Notes (Signed)
  Subjective:  Patient ID: [name] [name], female    DOB: [birth year],  MRN: [id number]  Chief Complaint  Patient presents with   Foot Pain    Rm 2 Patient is here for right heel pain. Pain present x 6 months.    Discussed the use of AI scribe software for clinical note transcription with the patient, who gave verbal consent to proceed.  History of Present Illness [name] [name] is a 68 year old female who presents with heel pain.  She has been experiencing heel pain for possibly less than six months, located at the back of her heel, specifically at the insertion of the Achilles tendon. She reports that her weight increased after retirement, noting that she was under 200 pounds before retiring and has since gained weight.  She has a history of smoking, having quit two and a half years ago after smoking for approximately thirty years. She also has a history of neuropathy, which she associates with quitting drinking, leading to dizziness and balance issues.  She has been using shoes marketed for neuropathy for less than a year, which she believes help with balance but may lack adequate support, potentially contributing to her heel pain. She prefers going barefoot and has tried various shoes without success.  She denies a history of diabetes. She has not had any circulation testing done previously.      Objective:    Physical Exam VASCULAR: DP and PT pulse nylon palpable. Foot is at the toes with sluggish cap fill time DERMATOLOGIC: Normal skin turgor, texture, and temperature. No open lesions, rashes, or ulcerations. NEUROLOGIC: Normal sensation to light touch and pressure. No paresthesias on examination. ORTHOPEDIC: Sharp pain at posterior right heel. Pain with compression at Achilles insertion and midsubstance. No rupture, negative Thompson and Homan's test.    No images are attached to the encounter.    Results RADIOLOGY Right heel radiograph: Posterior calcaneal spur   Assessment:    1. Right foot pain      Plan:  Patient was evaluated and treated and all questions answered.  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 No rupture is present, and tests for rupture are negative. Likely exacerbated by inadequate footwear and possibly decreased circulation in the leg and foot. Smoking may contribute to circulation issues. Flexible shoes without proper arch support may have initiated or worsened the condition. - Order noninvasive vascular testing - Prescribe a course of anti-inflammatory medication, such as a week of prednisone or a steroid, to reduce inflammation. - Provide a walking boot to offload pressure from the tendon for three to four weeks. - Recommend physical therapy and exercises to strengthen the tendon. - Advise on shoe gear changes, recommending shoes with proper arch support and a sturdy outsole. - Provide exercises for home use and offer referral to physical therapy if home exercises are insufficient. - Schedule follow-up appointment in six weeks to assess progress.      Return in about 6 weeks (around 03/20/2024) for re-check Achilles tendon.

## 2024-02-07 NOTE — Patient Instructions (Addendum)
 VISIT SUMMARY: Today, you were seen for heel pain that you have been experiencing for about six months. The pain is located at the back of your heel, specifically where the Achilles tendon attaches. We discussed your history of smoking, weight gain after retirement, and the types of shoes you have been using. You mentioned that you have been using shoes marketed for neuropathy, but they may not provide adequate support, which could be contributing to your heel pain.  YOUR PLAN: -RIGHT ACHILLES TENDINITIS: Achilles tendinitis is the inflammation of the Achilles tendon, which connects your calf muscles to your heel bone. This condition can cause pain and swelling at the back of your heel. To address this, we will order circulation testing, possibly including an ultrasound or blood pressure testing on your leg. You will be prescribed a course of anti-inflammatory medication, such as a week of prednisone or a steroid, to reduce inflammation. You will also be provided with a walking boot to offload pressure from the tendon for three to four weeks. Physical therapy and exercises to strengthen the tendon are recommended. Additionally, we advise changing your shoe gear to ones with proper arch support and a sturdy outsole. We will provide you with exercises for home use and offer a referral to physical therapy if home exercises are insufficient.  INSTRUCTIONS: Please schedule a follow-up appointment in six weeks to assess your progress. In the meantime, follow the prescribed course of anti-inflammatory medication, use the walking boot as directed, and start the recommended exercises. If you have any concerns or if your symptoms worsen, please contact our office.                      Contains text generated by [employer].              Achilles Tendinitis  with Rehab Achilles tendinitis is a disorder of the Achilles tendon. The Achilles tendon connects the large calf muscles  (Gastrocnemius and Soleus) to the heel bone (calcaneus). This tendon is sometimes called the heel cord. It is important for pushing-off and standing on your toes and is important for walking, running, or jumping. Tendinitis is often caused by overuse and repetitive microtrauma. SYMPTOMS Pain, tenderness, swelling, warmth, and redness may occur over the Achilles tendon even at rest. Pain with pushing off, or flexing or extending the ankle. Pain that is worsened after or during activity. CAUSES  Overuse sometimes seen with rapid increase in exercise programs or in sports requiring running and jumping. Poor physical conditioning (strength and flexibility or endurance). Running sports, especially training running down hills. Inadequate warm-up before practice or play or failure to stretch before participation. Injury to the tendon. PREVENTION  Warm up and stretch before practice or competition. Allow time for adequate rest and recovery between practices and competition. Keep up conditioning. Keep up ankle and leg flexibility. Improve or keep muscle strength and endurance. Improve cardiovascular fitness. Use proper technique. Use proper equipment (shoes, skates). To help prevent recurrence, taping, protective strapping, or an adhesive bandage may be recommended for several weeks after healing is complete. PROGNOSIS  Recovery may take weeks to several months to heal. Longer recovery is expected if symptoms have been prolonged. Recovery is usually quicker if the inflammation is due to a direct blow as compared with overuse or sudden strain. RELATED COMPLICATIONS  Healing time will be prolonged if the condition is not correctly treated. The injury must be given plenty of time to heal. Symptoms can reoccur if activity  is resumed too soon. Untreated, tendinitis may increase the risk of tendon rupture requiring additional time for recovery and possibly surgery. TREATMENT  The first treatment  consists of rest anti-inflammatory medication, and ice to relieve the pain. Stretching and strengthening exercises after resolution of pain will likely help reduce the risk of recurrence. Referral to a physical therapist or athletic trainer for further evaluation and treatment may be helpful. A walking boot or cast may be recommended to rest the Achilles tendon. This can help break the cycle of inflammation and microtrauma. Arch supports (orthotics) may be prescribed or recommended by your caregiver as an adjunct to therapy and rest. Surgery to remove the inflamed tendon lining or degenerated tendon tissue is rarely necessary and has shown less than predictable results. MEDICATION  Nonsteroidal anti-inflammatory medications, such as aspirin and ibuprofen, may be used for pain and inflammation relief. Do not take within 7 days before surgery. Take these as directed by your caregiver. Contact your caregiver immediately if any bleeding, stomach upset, or signs of allergic reaction occur. Other minor pain relievers, such as acetaminophen, may also be used. Pain relievers may be prescribed as necessary by your caregiver. Do not take prescription pain medication for longer than 4 to 7 days. Use only as directed and only as much as you need. Cortisone injections are rarely indicated. Cortisone injections may weaken tendons and predispose to rupture. It is better to give the condition more time to heal than to use them. HEAT AND COLD Cold is used to relieve pain and reduce inflammation for acute and chronic Achilles tendinitis. Cold should be applied for 10 to 15 minutes every 2 to 3 hours for inflammation and pain and immediately after any activity that aggravates your symptoms. Use ice packs or an ice massage. Heat may be used before performing stretching and strengthening activities prescribed by your caregiver. Use a heat pack or a warm soak. SEEK MEDICAL CARE IF: Symptoms get worse or do not improve in 2  weeks despite treatment. New, unexplained symptoms develop. Drugs used in treatment may produce side effects.  EXERCISES:  RANGE OF MOTION (ROM) AND STRETCHING EXERCISES - Achilles Tendinitis  These exercises may help you when beginning to rehabilitate your injury. Your symptoms may resolve with or without further involvement from your physician, physical therapist or athletic trainer. While completing these exercises, remember:  Restoring tissue flexibility helps normal motion to return to the joints. This allows healthier, less painful movement and activity. An effective stretch should be held for at least 30 seconds. A stretch should never be painful. You should only feel a gentle lengthening or release in the stretched tissue.  STRETCH  Gastroc, Standing  Place hands on wall. Extend right / left leg, keeping the front knee somewhat bent. Slightly point your toes inward on your back foot. Keeping your right / left heel on the floor and your knee straight, shift your weight toward the wall, not allowing your back to arch. You should feel a gentle stretch in the right / left calf. Hold this position for 10 seconds. Repeat 3 times. Complete this stretch 2 times per day.  STRETCH  Soleus, Standing  Place hands on wall. Extend right / left leg, keeping the other knee somewhat bent. Slightly point your toes inward on your back foot. Keep your right / left heel on the floor, bend your back knee, and slightly shift your weight over the back leg so that you feel a gentle stretch deep in your back  calf. Hold this position for 10 seconds. Repeat 3 times. Complete this stretch 2 times per day.  STRETCH  Gastrocsoleus, Standing  Note: This exercise can place a lot of stress on your foot and ankle. Please complete this exercise only if specifically instructed by your caregiver.  Place the ball of your right / left foot on a step, keeping your other foot firmly on the same step. Hold on to the wall  or a rail for balance. Slowly lift your other foot, allowing your body weight to press your heel down over the edge of the step. You should feel a stretch in your right / left calf. Hold this position for 10 seconds. Repeat this exercise with a slight bend in your knee. Repeat 3 times. Complete this stretch 2 times per day.   STRENGTHENING EXERCISES - Achilles Tendinitis These exercises may help you when beginning to rehabilitate your injury. They may resolve your symptoms with or without further involvement from your physician, physical therapist or athletic trainer. While completing these exercises, remember:  Muscles can gain both the endurance and the strength needed for everyday activities through controlled exercises. Complete these exercises as instructed by your physician, physical therapist or athletic trainer. Progress the resistance and repetitions only as guided. You may experience muscle soreness or fatigue, but the pain or discomfort you are trying to eliminate should never worsen during these exercises. If this pain does worsen, stop and make certain you are following the directions exactly. If the pain is still present after adjustments, discontinue the exercise until you can discuss the trouble with your clinician.  STRENGTH - Plantar-flexors  Sit with your right / left leg extended. Holding onto both ends of a rubber exercise band/tubing, loop it around the ball of your foot. Keep a slight tension in the band. Slowly push your toes away from you, pointing them downward. Hold this position for 10 seconds. Return slowly, controlling the tension in the band/tubing. Repeat 3 times. Complete this exercise 2 times per day.   STRENGTH - Plantar-flexors  Stand with your feet shoulder width apart. Steady yourself with a wall or table using as little support as needed. Keeping your weight evenly spread over the width of your feet, rise up on your toes.* Hold this position for 10  seconds. Repeat 3 times. Complete this exercise 2 times per day.  *If this is too easy, shift your weight toward your right / left leg until you feel challenged. Ultimately, you may be asked to do this exercise with your right / left foot only.  STRENGTH  Plantar-flexors, Eccentric  Note: This exercise can place a lot of stress on your foot and ankle. Please complete this exercise only if specifically instructed by your caregiver.  Place the balls of your feet on a step. With your hands, use only enough support from a wall or rail to keep your balance. Keep your knees straight and rise up on your toes. Slowly shift your weight entirely to your right / left toes and pick up your opposite foot. Gently and with controlled movement, lower your weight through your right / left foot so that your heel drops below the level of the step. You will feel a slight stretch in the back of your calf at the end position. Use the healthy leg to help rise up onto the balls of both feet, then lower weight only on the right / left leg again. Build up to 15 repetitions. Then progress to 3 consecutive  sets of 15 repetitions.* After completing the above exercise, complete the same exercise with a slight knee bend (about 30 degrees). Again, build up to 15 repetitions. Then progress to 3 consecutive sets of 15 repetitions.* Perform this exercise 2 times per day.  *When you easily complete 3 sets of 15, your physician, physical therapist or athletic trainer may advise you to add resistance by wearing a backpack filled with additional weight.  STRENGTH - Plantar Flexors, Seated  Sit on a chair that allows your feet to rest flat on the ground. If necessary, sit at the edge of the chair. Keeping your toes firmly on the ground, lift your right / left heel as far as you can without increasing any discomfort in your ankle. Repeat 3 times. Complete this exercise 2 times a day.

## 2024-02-21 ENCOUNTER — Ambulatory Visit (HOSPITAL_COMMUNITY)
Admission: RE | Admit: 2024-02-21 | Discharge: 2024-02-21 | Disposition: A | Discharge status: Home / Self Care | Source: Ambulatory Visit | Attending: Podiatry | Admitting: Podiatry

## 2024-02-21 DIAGNOSIS — I739 Peripheral vascular disease, unspecified: Secondary | ICD-10-CM | POA: Diagnosis not present

## 2024-02-23 LAB — VAS US ABI WITH/WO TBI
Left ABI: 0.65
Right ABI: 0.61

## 2024-02-27 ENCOUNTER — Telehealth: Payer: Self-pay | Admitting: Podiatry

## 2024-02-27 DIAGNOSIS — I739 Peripheral vascular disease, unspecified: Secondary | ICD-10-CM

## 2024-02-27 NOTE — Telephone Encounter (Signed)
 Patient is calling about scan results, Patient was advised she would receive a call when scan comes in.

## 2024-02-27 NOTE — Telephone Encounter (Signed)
 Reviewed ABIs with patient has significant decrease referral to [facility] placed

## 2024-03-29 ENCOUNTER — Ambulatory Visit: Attending: Vascular Surgery | Admitting: Physician Assistant

## 2024-03-29 VITALS — BP 135/82 | HR 65 | Temp 97.7°F | Wt 225.8 lb

## 2024-03-29 DIAGNOSIS — I739 Peripheral vascular disease, unspecified: Secondary | ICD-10-CM

## 2024-03-31 NOTE — Progress Notes (Signed)
 Office Note   History of Present Illness   [name] [name] is a 69 y.o. ([birth year]) female who presents for PAD follow up.  She has a history of asymptomatic PAD with no prior vascular interventions.  She has been previously evaluated by our office for stabbing pain in both of her feet, likely due to neuropathy.  She returns today for repeat follow-up.  She states over the past 8 months she has had pain and tenderness to her Achilles tendons and heels bilaterally.  She says that she has aching pain around her heels intermittently throughout the day, which is worse with walking.  Occasionally her heels will hurt her at night, usually on the days that she has been more active.  She has been diagnosed with Achilles tendinitis and is currently followed by podiatry for this.  She was recently on a short course of steroids, which made her pain go away completely when she was on the medication.  She denies any rest pain, claudication, or tissue loss.  Current Outpatient Medications  Medication Sig Dispense Refill   albuterol (VENTOLIN HFA) 108 (90 Base) MCG/ACT inhaler Inhale 1 puff into the lungs every 4 (four) hours.     lisinopril (PRINIVIL,ZESTRIL) 20 MG tablet Take 20 mg by mouth daily.  11   methylPREDNISolone  (MEDROL  DOSEPAK) 4 MG TBPK tablet 6 day dose pack - take as directed 21 tablet 0   traZODone (DESYREL) 100 MG tablet Take 100 mg by mouth daily as needed.  2   TRELEGY ELLIPTA 200-62.5-25 MCG/ACT AEPB Inhale 1 puff into the lungs daily.     No current facility-administered medications for this visit.    REVIEW OF SYSTEMS (negative unless checked):   Cardiac:  []  Chest pain or chest pressure? []  Shortness of breath upon activity? []  Shortness of breath when lying flat? []  Irregular heart rhythm?  Vascular:  []  Pain in calf, thigh, or hip brought on by walking? []  Pain in feet at night that wakes you up from your sleep? []  Blood clot in your veins? []  Leg swelling?  Pulmonary:   []  Oxygen at home? []  Productive cough? []  Wheezing?  Neurologic:  []  Sudden weakness in arms or legs? []  Sudden numbness in arms or legs? []  Sudden onset of difficult speaking or slurred speech? []  Temporary loss of vision in one eye? []  Problems with dizziness?  Gastrointestinal:  []  Blood in stool? []  Vomited blood?  Genitourinary:  []  Burning when urinating? []  Blood in urine?  Psychiatric:  []  Major depression  Hematologic:  []  Bleeding problems? []  Problems with blood clotting?  Dermatologic:  []  Rashes or ulcers?  Constitutional:  []  Fever or chills?  Ear/Nose/Throat:  []  Change in hearing? []  Nose bleeds? []  Sore throat?  Musculoskeletal:  []  Back pain? []  Joint pain? []  Muscle pain?   Physical Examination   Vitals:   03/29/24 0822  BP: 135/82  Pulse: 65  Temp: 97.7 F (36.5 C)  TempSrc: Temporal  Weight: 225 lb 12.8 oz (102.4 kg)   Body mass index is 36.45 kg/m.  General:  WDWN in NAD; vital signs documented above Gait: Not observed HENT: WNL, normocephalic Pulmonary: normal non-labored breathing Cardiac: regular Abdomen: soft, NT, no masses Skin: without rashes Vascular Exam/Pulses: Nonpalpable pedal pulses bilaterally. Brisk monophasic DP/PT doppler signals bilaterally Extremities: without ischemic changes, without gangrene , without cellulitis; without open wounds;  Musculoskeletal: no muscle wasting or atrophy  Neurologic: A&O X 3;  No focal weakness or paresthesias are  detected Psychiatric:  The pt has Normal affect.  Non-Invasive Vascular imaging   ABI (02/21/2024) R:  ABI: 0.61 (0.8),  PT: mono DP: mono TBI:  0.32 L:  ABI: 0.65 (0.99),  PT: mono DP: mono TBI: 0.49   Medical Decision Making   [name] [name] is a 68 y.o. female who presents for surveillance of PAD  Based on the patient's vascular studies, her ABIs have decreased bilaterally since her last office visit.  Her right ABI is 0.61 and left ABI is 0.65 At  follow-up today she denies any rest pain, claudication, or tissue loss.  She does report intermittent heel pain bilaterally which is aggravated by standing and walking.  She also endorses tenderness to her Achilles tendons and heels bilaterally.  She has been diagnosed with Achilles tendinitis by her podiatrist.  She states that her pain briefly went away when she was on a short course of steroids. On exam she has nonpalpable pedal pulses bilaterally.  She has brisk DP/PT Doppler signals bilaterally.  She has no tissue loss. Given the patient's history, I do not think that her pain is related to arterial insufficiency.  Her pain appears consistent with Achilles tendinitis.  I have encouraged her to take a daily aspirin and statin.  She does not require any vascular intervention at this time. She can follow-up with our office in 1 year with repeat ABIs   Ahmed Holster PA-C [facility] Office: [phone]  Clinic MD: [place]
